# Patient Record
Sex: Female | Born: 1950 | Race: White | Hispanic: No | Marital: Married | Smoking: Never smoker
Health system: Southern US, Community
[De-identification: ages and names within clinical notes are randomized; demographics above are authoritative.]

## PROBLEM LIST (undated history)

## (undated) DIAGNOSIS — Z201 Contact with and (suspected) exposure to tuberculosis: Secondary | ICD-10-CM

## (undated) DIAGNOSIS — L4050 Arthropathic psoriasis, unspecified: Secondary | ICD-10-CM

## (undated) HISTORY — PX: TONSILLECTOMY: SUR1361

## (undated) HISTORY — PX: ABDOMINAL HYSTERECTOMY: SHX81

---

## 1981-05-26 HISTORY — PX: BREAST EXCISIONAL BIOPSY: SUR124

## 1981-05-26 HISTORY — PX: BREAST BIOPSY: SHX20

## 2006-02-23 ENCOUNTER — Ambulatory Visit: Payer: Self-pay | Admitting: Specialist

## 2007-07-20 ENCOUNTER — Ambulatory Visit: Payer: Self-pay | Admitting: Internal Medicine

## 2008-07-20 ENCOUNTER — Ambulatory Visit: Payer: Self-pay | Admitting: Internal Medicine

## 2009-08-23 ENCOUNTER — Ambulatory Visit: Payer: Self-pay | Admitting: Internal Medicine

## 2010-08-18 ENCOUNTER — Emergency Department: Payer: Self-pay | Admitting: Internal Medicine

## 2010-09-12 ENCOUNTER — Ambulatory Visit: Payer: Self-pay | Admitting: Internal Medicine

## 2011-10-08 ENCOUNTER — Ambulatory Visit: Payer: Self-pay | Admitting: Internal Medicine

## 2012-11-08 ENCOUNTER — Ambulatory Visit: Payer: Self-pay | Admitting: Internal Medicine

## 2014-01-10 ENCOUNTER — Ambulatory Visit: Payer: Self-pay | Admitting: Internal Medicine

## 2014-12-12 ENCOUNTER — Other Ambulatory Visit: Payer: Self-pay | Admitting: Internal Medicine

## 2014-12-12 DIAGNOSIS — Z1231 Encounter for screening mammogram for malignant neoplasm of breast: Secondary | ICD-10-CM

## 2015-01-12 ENCOUNTER — Ambulatory Visit: Payer: Self-pay

## 2015-01-18 ENCOUNTER — Ambulatory Visit
Admission: RE | Admit: 2015-01-18 | Discharge: 2015-01-18 | Disposition: A | Discharge status: Home / Self Care | Payer: 59 | Source: Ambulatory Visit | Attending: Internal Medicine | Admitting: Internal Medicine

## 2015-01-18 DIAGNOSIS — Z1231 Encounter for screening mammogram for malignant neoplasm of breast: Secondary | ICD-10-CM | POA: Insufficient documentation

## 2015-12-14 ENCOUNTER — Other Ambulatory Visit: Payer: Self-pay | Admitting: Internal Medicine

## 2015-12-14 DIAGNOSIS — Z1231 Encounter for screening mammogram for malignant neoplasm of breast: Secondary | ICD-10-CM

## 2016-01-24 ENCOUNTER — Ambulatory Visit
Admission: RE | Admit: 2016-01-24 | Discharge: 2016-01-24 | Disposition: A | Discharge status: Home / Self Care | Payer: BLUE CROSS/BLUE SHIELD | Source: Ambulatory Visit | Attending: Internal Medicine | Admitting: Internal Medicine

## 2016-01-24 DIAGNOSIS — Z1231 Encounter for screening mammogram for malignant neoplasm of breast: Secondary | ICD-10-CM | POA: Diagnosis not present

## 2016-06-30 ENCOUNTER — Other Ambulatory Visit: Payer: Self-pay | Admitting: Otolaryngology

## 2016-06-30 DIAGNOSIS — H9190 Unspecified hearing loss, unspecified ear: Secondary | ICD-10-CM

## 2016-07-11 ENCOUNTER — Ambulatory Visit: Admission: RE | Admit: 2016-07-11 | Payer: BLUE CROSS/BLUE SHIELD | Source: Ambulatory Visit

## 2016-08-06 ENCOUNTER — Ambulatory Visit
Admission: RE | Admit: 2016-08-06 | Discharge: 2016-08-06 | Disposition: A | Discharge status: Home / Self Care | Payer: Medicare HMO | Source: Ambulatory Visit | Attending: Otolaryngology | Admitting: Otolaryngology

## 2016-08-06 DIAGNOSIS — H9190 Unspecified hearing loss, unspecified ear: Secondary | ICD-10-CM | POA: Diagnosis present

## 2016-08-06 LAB — POCT I-STAT CREATININE: CREATININE: 0.9 mg/dL (ref 0.44–1.00)

## 2016-08-06 MED ORDER — GADOBENATE DIMEGLUMINE 529 MG/ML IV SOLN
15.0000 mL | Freq: Once | INTRAVENOUS | Status: AC | PRN
  Administered 2016-08-06: 15 mL via INTRAVENOUS

## 2016-12-12 ENCOUNTER — Other Ambulatory Visit: Payer: Self-pay | Admitting: Internal Medicine

## 2016-12-18 ENCOUNTER — Other Ambulatory Visit: Payer: Self-pay | Admitting: Internal Medicine

## 2016-12-18 DIAGNOSIS — Z1231 Encounter for screening mammogram for malignant neoplasm of breast: Secondary | ICD-10-CM

## 2016-12-18 DIAGNOSIS — E538 Deficiency of other specified B group vitamins: Secondary | ICD-10-CM | POA: Insufficient documentation

## 2016-12-18 DIAGNOSIS — E782 Mixed hyperlipidemia: Secondary | ICD-10-CM | POA: Insufficient documentation

## 2017-01-15 ENCOUNTER — Ambulatory Visit
Admission: RE | Admit: 2017-01-15 | Discharge: 2017-01-15 | Disposition: A | Discharge status: Home / Self Care | Payer: Medicare HMO | Source: Ambulatory Visit | Attending: Internal Medicine | Admitting: Internal Medicine

## 2017-01-15 DIAGNOSIS — Z1231 Encounter for screening mammogram for malignant neoplasm of breast: Secondary | ICD-10-CM

## 2017-01-15 DIAGNOSIS — R928 Other abnormal and inconclusive findings on diagnostic imaging of breast: Secondary | ICD-10-CM | POA: Diagnosis not present

## 2017-01-15 DIAGNOSIS — N6310 Unspecified lump in the right breast, unspecified quadrant: Secondary | ICD-10-CM | POA: Insufficient documentation

## 2017-01-19 ENCOUNTER — Other Ambulatory Visit: Payer: Self-pay | Admitting: Internal Medicine

## 2017-01-19 DIAGNOSIS — N6310 Unspecified lump in the right breast, unspecified quadrant: Secondary | ICD-10-CM

## 2017-01-19 DIAGNOSIS — N6320 Unspecified lump in the left breast, unspecified quadrant: Secondary | ICD-10-CM

## 2017-01-19 DIAGNOSIS — R928 Other abnormal and inconclusive findings on diagnostic imaging of breast: Secondary | ICD-10-CM

## 2017-01-29 ENCOUNTER — Ambulatory Visit
Admission: RE | Admit: 2017-01-29 | Discharge: 2017-01-29 | Disposition: A | Discharge status: Home / Self Care | Payer: Medicare HMO | Source: Ambulatory Visit | Attending: Internal Medicine | Admitting: Internal Medicine

## 2017-01-29 DIAGNOSIS — N6310 Unspecified lump in the right breast, unspecified quadrant: Secondary | ICD-10-CM | POA: Diagnosis present

## 2017-01-29 DIAGNOSIS — N6320 Unspecified lump in the left breast, unspecified quadrant: Secondary | ICD-10-CM

## 2017-01-29 DIAGNOSIS — N6002 Solitary cyst of left breast: Secondary | ICD-10-CM | POA: Insufficient documentation

## 2017-01-29 DIAGNOSIS — N6001 Solitary cyst of right breast: Secondary | ICD-10-CM | POA: Diagnosis not present

## 2017-01-29 DIAGNOSIS — R928 Other abnormal and inconclusive findings on diagnostic imaging of breast: Secondary | ICD-10-CM

## 2017-12-25 ENCOUNTER — Other Ambulatory Visit: Payer: Self-pay | Admitting: Internal Medicine

## 2017-12-25 DIAGNOSIS — Z1231 Encounter for screening mammogram for malignant neoplasm of breast: Secondary | ICD-10-CM

## 2018-02-04 ENCOUNTER — Ambulatory Visit
Admission: RE | Admit: 2018-02-04 | Discharge: 2018-02-04 | Disposition: A | Discharge status: Home / Self Care | Payer: Medicare HMO | Source: Ambulatory Visit | Attending: Internal Medicine | Admitting: Internal Medicine

## 2018-02-04 DIAGNOSIS — Z1231 Encounter for screening mammogram for malignant neoplasm of breast: Secondary | ICD-10-CM | POA: Insufficient documentation

## 2019-01-10 ENCOUNTER — Other Ambulatory Visit: Payer: Self-pay | Admitting: Internal Medicine

## 2019-01-10 DIAGNOSIS — Z1231 Encounter for screening mammogram for malignant neoplasm of breast: Secondary | ICD-10-CM

## 2019-02-17 ENCOUNTER — Ambulatory Visit
Admission: RE | Admit: 2019-02-17 | Discharge: 2019-02-17 | Disposition: A | Discharge status: Home / Self Care | Payer: Medicare HMO | Source: Ambulatory Visit | Attending: Internal Medicine | Admitting: Internal Medicine

## 2019-02-17 DIAGNOSIS — Z1231 Encounter for screening mammogram for malignant neoplasm of breast: Secondary | ICD-10-CM | POA: Diagnosis present

## 2019-08-05 ENCOUNTER — Other Ambulatory Visit: Payer: Self-pay | Admitting: Surgery

## 2019-08-05 ENCOUNTER — Other Ambulatory Visit: Payer: Self-pay | Admitting: Internal Medicine

## 2019-08-05 DIAGNOSIS — M5416 Radiculopathy, lumbar region: Secondary | ICD-10-CM

## 2019-08-05 DIAGNOSIS — M5431 Sciatica, right side: Secondary | ICD-10-CM

## 2019-08-14 ENCOUNTER — Ambulatory Visit
Admission: RE | Admit: 2019-08-14 | Discharge: 2019-08-14 | Disposition: A | Discharge status: Home / Self Care | Payer: Medicare HMO | Source: Ambulatory Visit | Attending: Internal Medicine | Admitting: Internal Medicine

## 2019-08-14 ENCOUNTER — Other Ambulatory Visit: Payer: Self-pay

## 2019-08-14 DIAGNOSIS — M5431 Sciatica, right side: Secondary | ICD-10-CM | POA: Diagnosis present

## 2019-08-14 DIAGNOSIS — M5416 Radiculopathy, lumbar region: Secondary | ICD-10-CM | POA: Diagnosis not present

## 2019-08-19 DIAGNOSIS — G8929 Other chronic pain: Secondary | ICD-10-CM | POA: Insufficient documentation

## 2019-08-19 DIAGNOSIS — M5441 Lumbago with sciatica, right side: Secondary | ICD-10-CM | POA: Insufficient documentation

## 2020-02-06 ENCOUNTER — Other Ambulatory Visit: Payer: Self-pay | Admitting: Internal Medicine

## 2020-02-06 DIAGNOSIS — Z1231 Encounter for screening mammogram for malignant neoplasm of breast: Secondary | ICD-10-CM

## 2020-02-23 ENCOUNTER — Ambulatory Visit
Admission: RE | Admit: 2020-02-23 | Discharge: 2020-02-23 | Disposition: A | Discharge status: Home / Self Care | Payer: Medicare HMO | Source: Ambulatory Visit | Attending: Internal Medicine | Admitting: Internal Medicine

## 2020-02-23 ENCOUNTER — Other Ambulatory Visit: Payer: Self-pay

## 2020-02-23 DIAGNOSIS — Z1231 Encounter for screening mammogram for malignant neoplasm of breast: Secondary | ICD-10-CM | POA: Diagnosis not present

## 2020-08-20 ENCOUNTER — Ambulatory Visit (LOCAL_COMMUNITY_HEALTH_CENTER): Payer: Self-pay

## 2020-08-20 ENCOUNTER — Other Ambulatory Visit: Payer: Self-pay

## 2020-08-20 VITALS — Ht 62.0 in | Wt 149.0 lb

## 2020-08-20 DIAGNOSIS — R7612 Nonspecific reaction to cell mediated immunity measurement of gamma interferon antigen response without active tuberculosis: Secondary | ICD-10-CM

## 2020-08-20 NOTE — Progress Notes (Addendum)
EPI completed via phone.  +QFT referral from [facility] Rheumatology. 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 States last TB test was probably when she was a teenager and was negative. No known exposures to TB.  Started Methotrexate last Friday (08/10/20) and has her next appt with Dr. Allena Katz 09/20/20.  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   After reviewing patient's hx, TB RN suggested that patient have QFT repeated at her next Rheum appt. TB RN will mail TB info and TB RN contact info and add patient to Rifampin list.  Declines HIV testing at this time via phone. Richmond Campbell, RN   See care everywhere for [facility] Rheum notes and labs.  CXR sent for scanning Richmond Campbell, RN

## 2020-08-21 ENCOUNTER — Telehealth: Payer: Self-pay

## 2020-08-21 NOTE — Telephone Encounter (Signed)
Fax sent to [facility] Rheumatology suggesting pt have repeat QFT at next appt.  TB RN attempted calling office but had to hang up d/t long wait times. Richmond Campbell, RN

## 2020-09-10 DIAGNOSIS — R7612 Nonspecific reaction to cell mediated immunity measurement of gamma interferon antigen response without active tuberculosis: Secondary | ICD-10-CM | POA: Insufficient documentation

## 2020-09-10 NOTE — Progress Notes (Signed)
Received fax from Dr. Gerome Apley re: patient's repeat (2nd) QFT.  See care everywhere for 2 positive QFT TB results. TB RN will call patient once Rifampin issues resolve. EPI completed previously when 1st QFT received. Richmond Campbell, RN

## 2021-02-04 ENCOUNTER — Other Ambulatory Visit: Payer: Self-pay | Admitting: Internal Medicine

## 2021-02-04 DIAGNOSIS — Z1231 Encounter for screening mammogram for malignant neoplasm of breast: Secondary | ICD-10-CM

## 2021-02-28 ENCOUNTER — Ambulatory Visit
Admission: RE | Admit: 2021-02-28 | Discharge: 2021-02-28 | Disposition: A | Discharge status: Home / Self Care | Payer: Medicare HMO | Source: Ambulatory Visit | Attending: Internal Medicine | Admitting: Internal Medicine

## 2021-02-28 ENCOUNTER — Other Ambulatory Visit: Payer: Self-pay

## 2021-02-28 DIAGNOSIS — Z1231 Encounter for screening mammogram for malignant neoplasm of breast: Secondary | ICD-10-CM | POA: Insufficient documentation

## 2021-03-29 IMAGING — MG DIGITAL SCREENING BILAT W/ TOMO W/ CAD
8 series · 8 of 24 positions shown · non-contrast
Comparison: Previous exam(s).

CLINICAL DATA: Screening.

EXAM:
DIGITAL SCREENING BILATERAL MAMMOGRAM WITH TOMO AND CAD

[L MLO synth-2D]
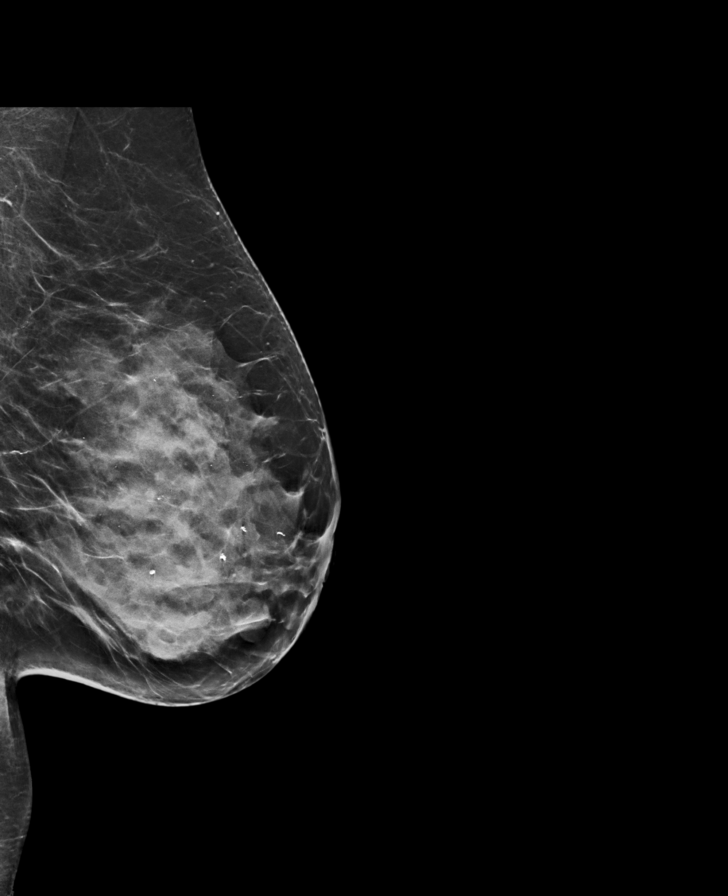

[R CC synth-2D]
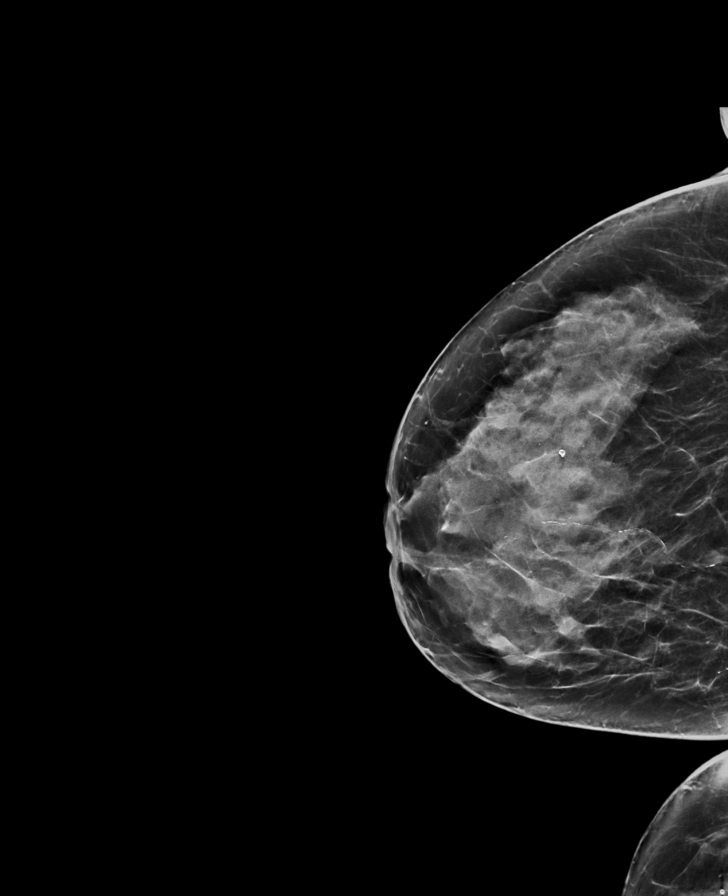

[R MLO synth-2D]
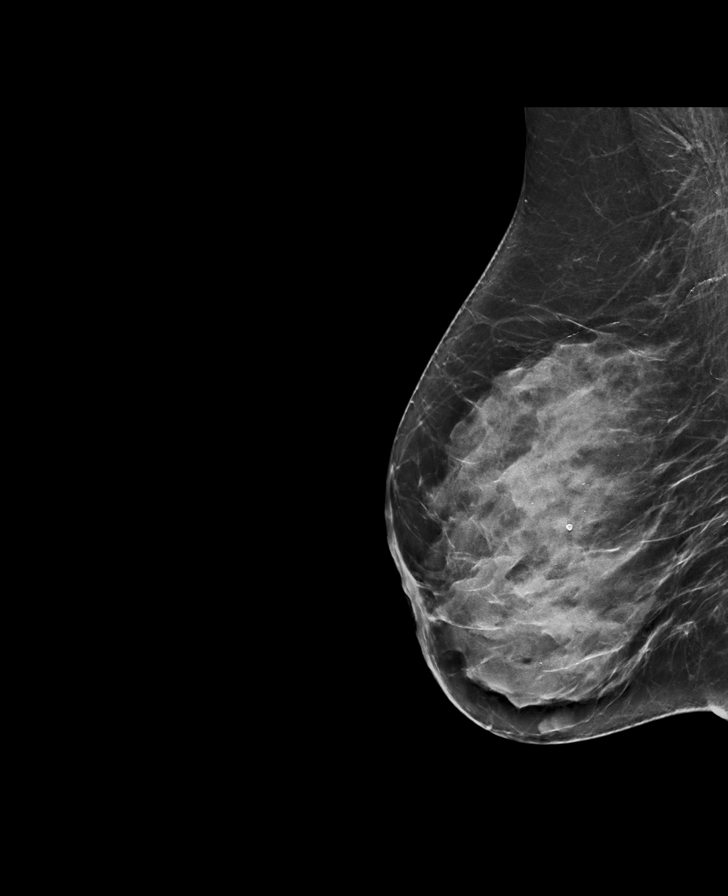

[L CC synth-2D]
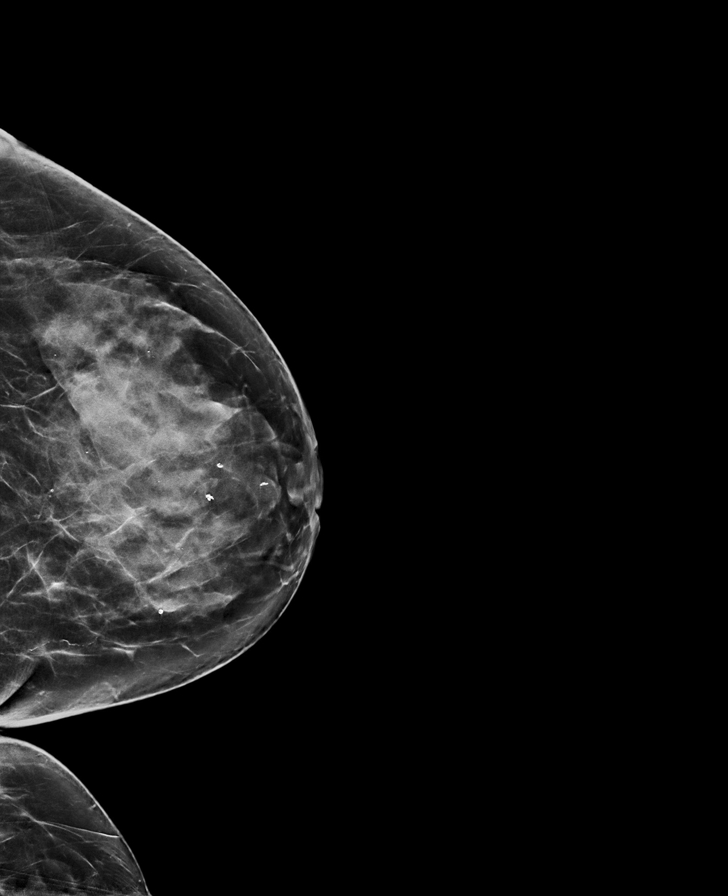

[L MLO tomo · tomo slice 33/65.0]
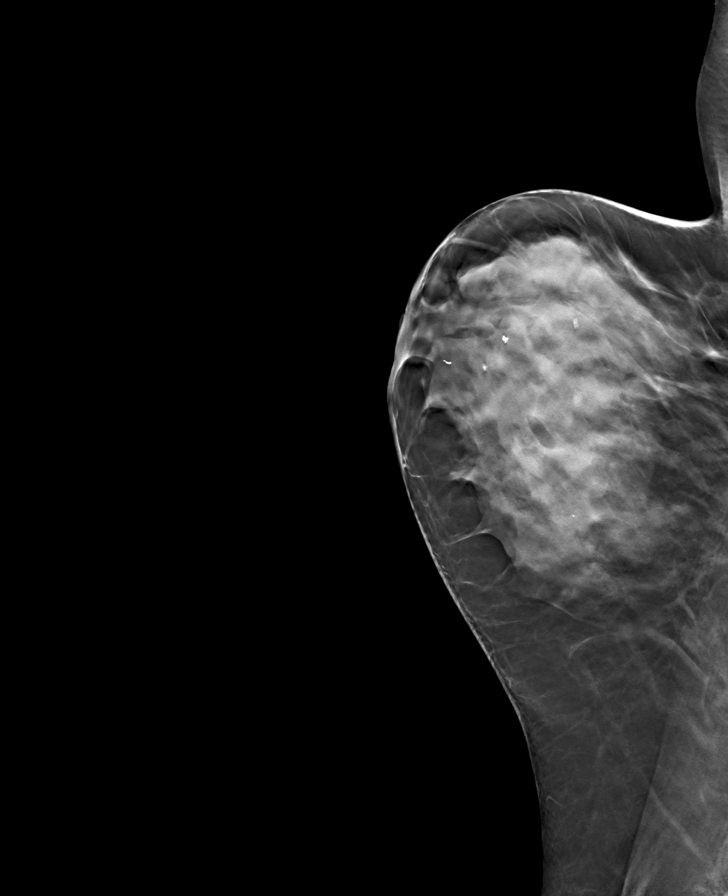

[R CC tomo · tomo slice 34/67.0]
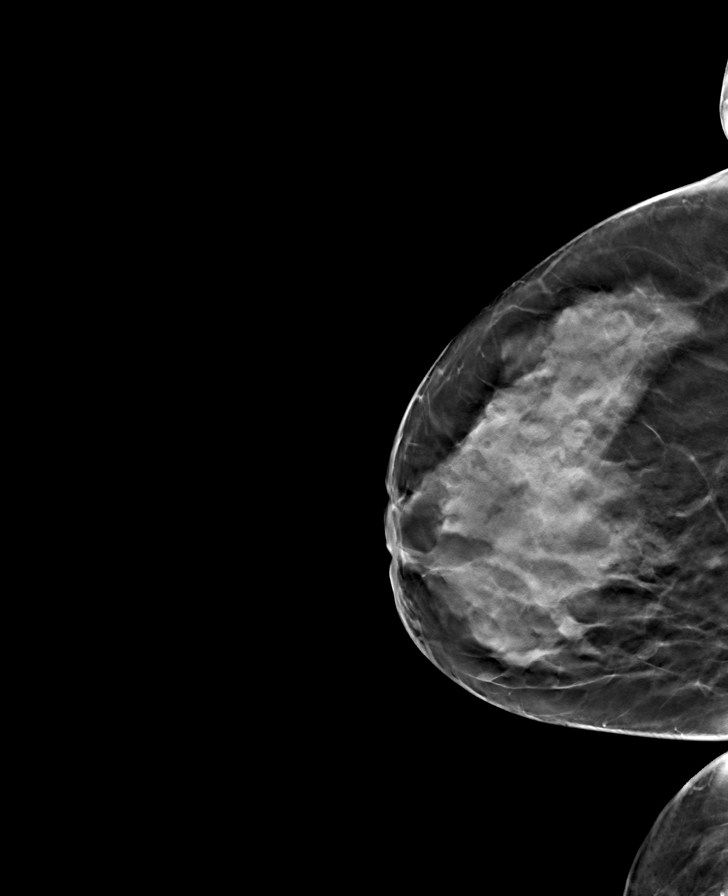

[R MLO tomo · tomo slice 34/67.0]
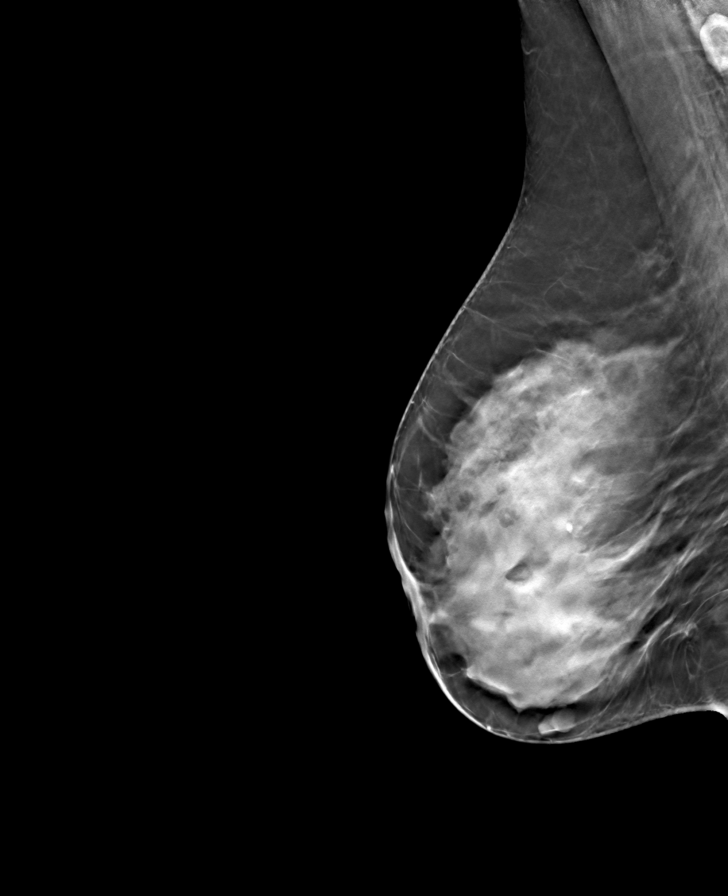

[L CC tomo · tomo slice 35/70.0]
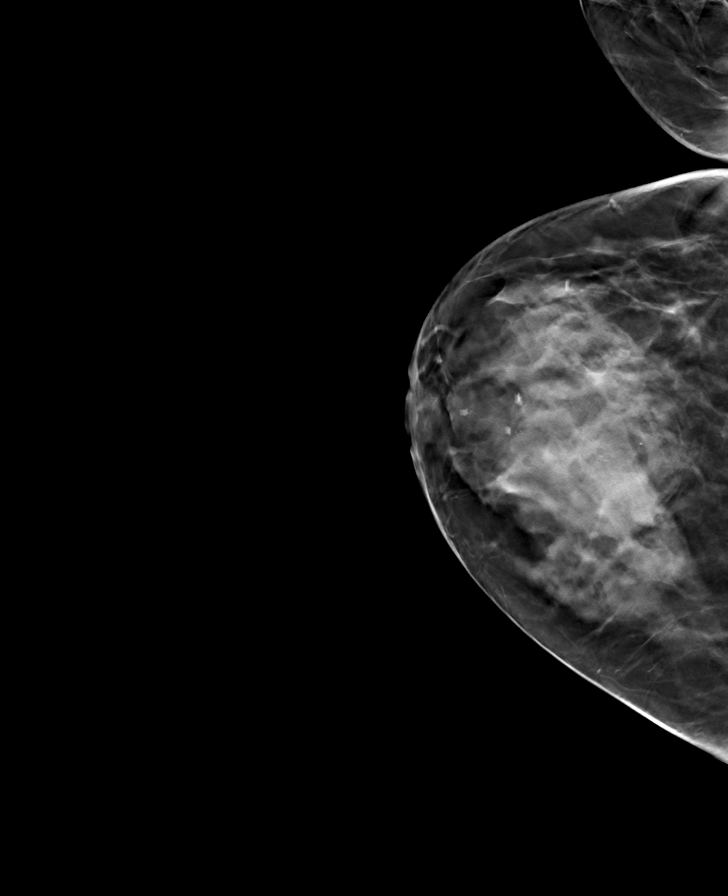

[8 of 24 positions shown; findings below may reference images not displayed]

ACR Breast Density Category d: The breast tissue is extremely dense,
which lowers the sensitivity of mammography
FINDINGS: There are no findings suspicious for malignancy. Images were
processed with CAD.
IMPRESSION: No mammographic evidence of malignancy. A result letter of this
screening mammogram will be mailed directly to the patient.

RECOMMENDATION:
Screening mammogram in one year. (Code:[id number])

BI-RADS CATEGORY  1: Negative.

## 2021-06-19 ENCOUNTER — Telehealth: Payer: Self-pay

## 2021-06-19 ENCOUNTER — Other Ambulatory Visit: Payer: Self-pay | Admitting: Surgery

## 2021-06-21 NOTE — Progress Notes (Signed)
Tuberculosis treatment orders  All patients are to be monitored per [place] and county TB policies.   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 Treat for latent TB per the following:  Rifampin 600mg  daily by mouth x 4 months, draw LFTs monthly per Dr. .  Baselines labs at TB start appt. Offer HIV testing.

## 2021-06-24 ENCOUNTER — Other Ambulatory Visit: Payer: Self-pay

## 2021-06-24 ENCOUNTER — Ambulatory Visit (LOCAL_COMMUNITY_HEALTH_CENTER): Payer: Self-pay

## 2021-06-24 VITALS — Ht 62.0 in | Wt 142.0 lb

## 2021-06-24 DIAGNOSIS — R7612 Nonspecific reaction to cell mediated immunity measurement of gamma interferon antigen response without active tuberculosis: Secondary | ICD-10-CM

## 2021-06-24 MED ORDER — RIFAMPIN 300 MG PO CAPS: 600.0000 mg | ORAL_CAPSULE | Freq: Every day | ORAL | 0 refills | Status: AC

## 2021-06-24 NOTE — Telephone Encounter (Signed)
TC from patient.  States she is still interested in Rifampin for LTBI tx.  TBM appt scheduled. Updated meds via phone, no changes with medical hx or problems.  No changes to EPI on 08/20/2020 Richmond Campbell, RN

## 2021-06-24 NOTE — Progress Notes (Signed)
No changes to EPI as of 06/21/21. Patient still interested in LTBI tx.  Updated medications. TB start scheduled for 06/24/21 Aileen Fass, RN

## 2021-06-25 LAB — CBC WITH DIFFERENTIAL/PLATELET
Basophils Absolute: 0 10*3/uL (ref 0.0–0.2)
Basos: 0 %
EOS (ABSOLUTE): 0.2 10*3/uL (ref 0.0–0.4)
Eos: 2 %
Hematocrit: 36.1 % (ref 34.0–46.6)
Hemoglobin: 12 g/dL (ref 11.1–15.9)
Immature Grans (Abs): 0 10*3/uL (ref 0.0–0.1)
Immature Granulocytes: 0 %
Lymphocytes Absolute: 3.1 10*3/uL (ref 0.7–3.1)
Lymphs: 40 %
MCH: 32.6 pg (ref 26.6–33.0)
MCHC: 33.2 g/dL (ref 31.5–35.7)
MCV: 98 fL — ABNORMAL HIGH (ref 79–97)
Monocytes Absolute: 0.4 10*3/uL (ref 0.1–0.9)
Monocytes: 5 %
Neutrophils Absolute: 4.2 10*3/uL (ref 1.4–7.0)
Neutrophils: 53 %
Platelets: 265 10*3/uL (ref 150–450)
RBC: 3.68 x10E6/uL — ABNORMAL LOW (ref 3.77–5.28)
RDW: 13.3 % (ref 11.7–15.4)
WBC: 7.8 10*3/uL (ref 3.4–10.8)

## 2021-06-25 LAB — HEPATIC FUNCTION PANEL
ALT: 45 IU/L — ABNORMAL HIGH (ref 0–32)
AST: 47 IU/L — ABNORMAL HIGH (ref 0–40)
Albumin: 4.8 g/dL (ref 3.8–4.8)
Alkaline Phosphatase: 62 IU/L (ref 44–121)
Bilirubin Total: 0.3 mg/dL (ref 0.0–1.2)
Bilirubin, Direct: <0.1 mg/dL (ref 0.00–0.40)
Total Protein: 6.7 g/dL (ref 6.0–8.5)

## 2021-06-25 NOTE — Progress Notes (Signed)
Pt in clinic today for Rifampin start appt.  LTBI tx start letter faxed to Dr. Allena Katz at [facility]. Rifampin 300mg  #60 dispensed per Dr. order. Wyvonnia Lora, RN   Next TBM appt 07/22/21

## 2021-07-04 ENCOUNTER — Telehealth: Payer: Self-pay

## 2021-07-05 NOTE — Telephone Encounter (Signed)
TC from patient.  C/o itching and hot flashes in the last few days after taking Rifampin at night.  Denies SOB, rash or abdominal pain. Patient instructed to stop Rifampin for today, tomorrow and Sunday to see if itching stops and hot flashes improve.  Patient will call TB control back on Monday and update.  Discussed may have to add in Zyrtec or Benadryl if she wants to continue Rifampin or change to INH.  Patient doesn't want to do INH x 6 months nor does she want to itch x 4 months.  Will await patient's TC on Monday. Richmond Campbell, RN

## 2021-07-08 ENCOUNTER — Telehealth: Payer: Self-pay

## 2021-07-22 ENCOUNTER — Ambulatory Visit (LOCAL_COMMUNITY_HEALTH_CENTER): Payer: Self-pay

## 2021-07-22 ENCOUNTER — Other Ambulatory Visit: Payer: Self-pay

## 2021-07-22 VITALS — Wt 142.5 lb

## 2021-07-22 DIAGNOSIS — R7612 Nonspecific reaction to cell mediated immunity measurement of gamma interferon antigen response without active tuberculosis: Secondary | ICD-10-CM

## 2021-07-22 MED ORDER — RIFAMPIN 300 MG PO CAPS: 600.0000 mg | ORAL_CAPSULE | Freq: Every day | ORAL | 0 refills | Status: AC

## 2021-07-22 NOTE — Progress Notes (Signed)
In nurse clinic for LTBI / TB med management / due for Rifampin #2.   Reports taking Rifampin daily as prescribed. Has approx 5 days worth of Rifampin remaining in current bottle. Pt explains she stopped the Rifampin for 3 days d/t the generatlized itching and "sweating".  See RN note 07/04/2021. Pt restarted the Rifampin  after 3 days off and takes Benadryl nightly and itching has dramatically improved. Continues to have "sweats" approx 2 hrs after taking Rifampin, in the evening,  and sweating is gone by next morning.   Pt explains she is committed to completing Rifampin.   Per Consult with Dr Ralene Bathe through epic message, ok to continue Rifampin.   Rifampin 300 mg #60 dispensed today per order by Ralene Bathe, MD. RN walked pt to lab for LFT's.  Next TB appt 08/19/2021 at 10:15 am, has reminder. Advised to contact [facility] with questions, concerns, side effects. TB coord. Contact card given. Pt declined Rifampin info sheet explaining she has one. Jerel Shepherd, RN

## 2021-07-23 ENCOUNTER — Telehealth: Payer: Self-pay

## 2021-07-23 LAB — HEPATIC FUNCTION PANEL
ALT: 12 IU/L (ref 0–32)
AST: 25 IU/L (ref 0–40)
Albumin: 4.7 g/dL (ref 3.8–4.8)
Alkaline Phosphatase: 66 IU/L (ref 44–121)
Bilirubin Total: <0.2 mg/dL (ref 0.0–1.2)
Bilirubin, Direct: <0.1 mg/dL (ref 0.00–0.40)
Total Protein: 6.8 g/dL (ref 6.0–8.5)

## 2021-07-23 NOTE — Telephone Encounter (Signed)
TC to patient re: if still taking Rifampin? States has continued taking Rifampin in the evenings and is taking Benadryl when she itches bad.  Still no rash and now she's wondering if its dry skin.  Still having hot flashes but not as bad as previously. Patient is committed to continuing Rifampin tx.  Dr. Alvester Morin consulted and is ok with this plan.Richmond Campbell, RN

## 2021-07-23 NOTE — Telephone Encounter (Signed)
Patient reports after stopping meds she still was having hot flashes and some itching.  CO may take benadryl with Rifampin or we can attempt INH for LTBI tx .  Patient would prefer to continue with Rifampin and f/u with TB nurse as needed.  Richmond Campbell, RN

## 2021-07-31 NOTE — Progress Notes (Signed)
Attestation of Attending Supervision of clinical support staff: I agree with the care provided to this patient and was available for any consultation.  I have reviewed the RN's note and chart. I was available for consult and to see the patient if needed.   Rosemary Pentecost Niles Chairty Toman, MD, MPH, ABFM Attending Physician Faculty Practice- [facility]  

## 2021-08-19 ENCOUNTER — Ambulatory Visit (LOCAL_COMMUNITY_HEALTH_CENTER): Payer: Medicare HMO

## 2021-08-19 ENCOUNTER — Other Ambulatory Visit: Payer: Self-pay

## 2021-08-19 VITALS — Wt 143.0 lb

## 2021-08-19 DIAGNOSIS — R7612 Nonspecific reaction to cell mediated immunity measurement of gamma interferon antigen response without active tuberculosis: Secondary | ICD-10-CM

## 2021-08-19 MED ORDER — RIFAMPIN 300 MG PO CAPS: 600.0000 mg | ORAL_CAPSULE | Freq: Every day | ORAL | 0 refills | Status: AC

## 2021-08-19 NOTE — Progress Notes (Signed)
Reports taking Rifampin daily as prescribed and has 7 days of pills remaining in current bottle.   ?Continues to have hot flashes within 2-3 hours after taking rifampin at 5 or 5:30 pm and states they continue during the night; none during the day.  Denies c/o itching as before and currently not needing the benadryl.  C/o both legs aching and right wrist  aching starting about 2 weeks ago but not daily.  Pt concerned that it could be related to rifampin.  Says alleve doesn't really help and she applies icy hot.   ? ?Wants to continue Rifampin and eager to complete.   ? ?Consult with Dr. Alvester Morin via epic message regarding leg aches and left wrist aching.  Dr. Alvester Morin said not likely due to rifampin, see PCP if needed, draw LFT today as scheduled and followup with these results; pt agreed. ? ?Rifampin 300 mg #60 dispensed today per order by Karyl Kinnier, MD. ? ?Next TB appt 09/18/21 at 10am ; has reminder.   ?Instructed to contact [facility] with questions, concerns, side effects. TB coord contact card given. ?Pt declined Rifampin info sheet because she has one.   ? ?Cherlynn Polo, RN  ? ? ?

## 2021-08-20 LAB — HEPATIC FUNCTION PANEL
ALT: 14 IU/L (ref 0–32)
AST: 19 IU/L (ref 0–40)
Albumin: 4.6 g/dL (ref 3.8–4.8)
Alkaline Phosphatase: 73 IU/L (ref 44–121)
Bilirubin Total: <0.2 mg/dL (ref 0.0–1.2)
Bilirubin, Direct: <0.1 mg/dL (ref 0.00–0.40)
Total Protein: 6.9 g/dL (ref 6.0–8.5)

## 2021-09-18 ENCOUNTER — Ambulatory Visit (LOCAL_COMMUNITY_HEALTH_CENTER): Payer: Self-pay | Admitting: Surgery

## 2021-09-18 VITALS — Wt 144.0 lb

## 2021-09-18 DIAGNOSIS — R7612 Nonspecific reaction to cell mediated immunity measurement of gamma interferon antigen response without active tuberculosis: Secondary | ICD-10-CM

## 2021-09-18 MED ORDER — RIFAMPIN 300 MG PO CAPS: 600.0000 mg | ORAL_CAPSULE | Freq: Every day | ORAL | 0 refills | Status: AC

## 2021-09-18 NOTE — Progress Notes (Signed)
Patient has been taking Rifampin 600 mg daily for 3 months for LTBI treatment. ? ?Patient is doing well on current medication regimen. No n/v/f/c, eating normally, no concerning weight loss. Patient reports taking medications daily as prescribed, she has 7 more days of pills. ? ?Dispensed #4 and final month of Rifampin for LTBI tx. Dispensed #60 300 mg capsules.  ? ?Explained to patient that will always be positive on PPD skin test and blood test for exposure to TB. If patient is asked by employer, school, other institution to take a TB test, patient should supply proof of treatment completion and/or obtain a TB Screening at the health department or at patient's PCP. This information was also provided in writing in the patient's completion letter which was given to the patient today along with a TB treatment completion card.  ? ?A completion letter was also sent by fax to the patient's PCP: ? ?Dr. Posey Pronto, MD (Rheum) [phone] (Fax) ?Dr. Iran Planas (PCP) [phone] (Fax)   ? ?All completion letters and completion card were sent for scanning into Epic. ? ?Patient was advised to contact [facility]/TB control phone for any concerning symptoms or questions.  ? ?FU LFTs which were drawn today. ? ?Leigh Aurora, MD  ?

## 2021-09-19 LAB — HEPATIC FUNCTION PANEL
ALT: 12 IU/L (ref 0–32)
AST: 21 IU/L (ref 0–40)
Albumin: 4.6 g/dL (ref 3.8–4.8)
Alkaline Phosphatase: 73 IU/L (ref 44–121)
Bilirubin Total: 0.2 mg/dL (ref 0.0–1.2)
Bilirubin, Direct: <0.1 mg/dL (ref 0.00–0.40)
Total Protein: 6.9 g/dL (ref 6.0–8.5)

## 2022-01-29 ENCOUNTER — Other Ambulatory Visit: Payer: Self-pay | Admitting: Internal Medicine

## 2022-01-29 DIAGNOSIS — Z1231 Encounter for screening mammogram for malignant neoplasm of breast: Secondary | ICD-10-CM

## 2022-03-27 ENCOUNTER — Ambulatory Visit
Admission: RE | Admit: 2022-03-27 | Discharge: 2022-03-27 | Disposition: A | Discharge status: Home / Self Care | Payer: Medicare HMO | Source: Ambulatory Visit | Attending: Internal Medicine | Admitting: Internal Medicine

## 2022-03-27 DIAGNOSIS — Z1231 Encounter for screening mammogram for malignant neoplasm of breast: Secondary | ICD-10-CM | POA: Diagnosis present

## 2023-02-18 ENCOUNTER — Other Ambulatory Visit: Payer: Self-pay | Admitting: Internal Medicine

## 2023-02-18 ENCOUNTER — Encounter: Payer: Self-pay | Admitting: Ophthalmology

## 2023-02-18 DIAGNOSIS — Z1231 Encounter for screening mammogram for malignant neoplasm of breast: Secondary | ICD-10-CM

## 2023-02-18 NOTE — Anesthesia Preprocedure Evaluation (Addendum)
Anesthesia Evaluation  Patient identified by MRN, date of birth, ID band Patient awake    Reviewed: Allergy & Precautions, H&P , NPO status , Patient's Chart, lab work & pertinent test results  Airway Mallampati: III  TM Distance: >3 FB Neck ROM: Full    Dental no notable dental hx. (+) Caps   Pulmonary neg pulmonary ROS   Pulmonary exam normal breath sounds clear to auscultation       Cardiovascular negative cardio ROS Normal cardiovascular exam Rhythm:Regular Rate:Normal     Neuro/Psych  Neuromuscular disease negative neurological ROS  negative psych ROS   GI/Hepatic negative GI ROS, Neg liver ROS,,,  Endo/Other  negative endocrine ROS    Renal/GU negative Renal ROS  negative genitourinary   Musculoskeletal negative musculoskeletal ROS (+) Arthritis ,    Abdominal   Peds negative pediatric ROS (+) Delivery details - Hematology negative hematology ROS (+)   Anesthesia Other Findings Psoriatic arthritis 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  Reproductive/Obstetrics negative OB ROS                             Anesthesia Physical Anesthesia Plan  ASA: 2  Anesthesia Plan: MAC   Post-op Pain Management:    Induction: Intravenous  PONV Risk Score and Plan:   Airway Management Planned: Natural Airway and Nasal Cannula  Additional Equipment:   Intra-op Plan:   Post-operative Plan:   Informed Consent: I have reviewed the patients History and Physical, chart, labs and discussed the procedure including the risks, benefits and alternatives for the proposed anesthesia with the patient or authorized representative who has indicated his/her understanding and acceptance.     Dental Advisory Given  Plan Discussed with: Anesthesiologist, CRNA and Surgeon  Anesthesia Plan Comments: (Patient consented for risks of anesthesia including but not limited to:  - adverse reactions to medications - damage to  eyes, teeth, lips or other oral mucosa - nerve damage due to positioning  - sore throat or hoarseness - Damage to heart, brain, nerves, lungs, other parts of body or loss of life  Patient voiced understanding.)       Anesthesia Quick Evaluation

## 2023-02-24 ENCOUNTER — Encounter: Payer: Self-pay | Admitting: Ophthalmology

## 2023-03-02 NOTE — Discharge Instructions (Signed)

## 2023-03-03 ENCOUNTER — Other Ambulatory Visit: Payer: Self-pay

## 2023-03-03 ENCOUNTER — Ambulatory Visit: Payer: Medicare HMO | Admitting: Anesthesiology

## 2023-03-03 ENCOUNTER — Ambulatory Visit
Admission: RE | Admit: 2023-03-03 | Discharge: 2023-03-03 | Disposition: A | Discharge status: Home / Self Care | Payer: Medicare HMO | Attending: Ophthalmology | Admitting: Ophthalmology

## 2023-03-03 ENCOUNTER — Encounter: Payer: Self-pay | Admitting: Ophthalmology

## 2023-03-03 ENCOUNTER — Encounter
Admission: RE | Disposition: A | Discharge status: Home / Self Care | Payer: Self-pay | Source: Home / Self Care | Attending: Ophthalmology

## 2023-03-03 DIAGNOSIS — H2512 Age-related nuclear cataract, left eye: Secondary | ICD-10-CM | POA: Diagnosis present

## 2023-03-03 HISTORY — PX: CATARACT EXTRACTION W/PHACO: SHX586

## 2023-03-03 HISTORY — DX: Arthropathic psoriasis, unspecified: L40.50

## 2023-03-03 HISTORY — DX: Contact with and (suspected) exposure to tuberculosis: Z20.1

## 2023-03-03 SURGERY — PHACOEMULSIFICATION, CATARACT, WITH IOL INSERTION
Anesthesia: Monitor Anesthesia Care | Site: Eye | Laterality: Left

## 2023-03-03 MED ORDER — SIGHTPATH DOSE#1 BSS IO SOLN
INTRAOCULAR | Status: DC | PRN
  Administered 2023-03-03: 15 mL via INTRAOCULAR

## 2023-03-03 MED ORDER — SIGHTPATH DOSE#1 BSS IO SOLN
INTRAOCULAR | Status: DC | PRN
  Administered 2023-03-03: 2 mL

## 2023-03-03 MED ORDER — MOXIFLOXACIN HCL 0.5 % OP SOLN
OPHTHALMIC | Status: DC | PRN
  Administered 2023-03-03: .2 mL via OPHTHALMIC

## 2023-03-03 MED ORDER — TETRACAINE HCL 0.5 % OP SOLN
OPHTHALMIC | Status: AC
  Filled 2023-03-03: qty 4

## 2023-03-03 MED ORDER — BRIMONIDINE TARTRATE-TIMOLOL 0.2-0.5 % OP SOLN
OPHTHALMIC | Status: DC | PRN
  Administered 2023-03-03: 1 [drp] via OPHTHALMIC

## 2023-03-03 MED ORDER — FENTANYL CITRATE (PF) 100 MCG/2ML IJ SOLN
INTRAMUSCULAR | Status: DC | PRN
  Administered 2023-03-03: 100 ug via INTRAVENOUS

## 2023-03-03 MED ORDER — ARMC OPHTHALMIC DILATING DROPS
OPHTHALMIC | Status: AC
  Filled 2023-03-03: qty 0.5

## 2023-03-03 MED ORDER — FENTANYL CITRATE (PF) 100 MCG/2ML IJ SOLN
INTRAMUSCULAR | Status: AC
  Filled 2023-03-03: qty 2

## 2023-03-03 MED ORDER — TETRACAINE HCL 0.5 % OP SOLN
1.0000 [drp] | OPHTHALMIC | Status: AC
  Administered 2023-03-03 (×3): 1 [drp] via OPHTHALMIC

## 2023-03-03 MED ORDER — SIGHTPATH DOSE#1 BSS IO SOLN
INTRAOCULAR | Status: DC | PRN
  Administered 2023-03-03: 45 mL via OPHTHALMIC

## 2023-03-03 MED ORDER — MIDAZOLAM HCL 2 MG/2ML IJ SOLN
INTRAMUSCULAR | Status: AC
  Filled 2023-03-03: qty 2

## 2023-03-03 MED ORDER — LACTATED RINGERS IV SOLN: INTRAVENOUS | Status: DC

## 2023-03-03 MED ORDER — MIDAZOLAM HCL 2 MG/2ML IJ SOLN
INTRAMUSCULAR | Status: DC | PRN
  Administered 2023-03-03: 2 mg via INTRAVENOUS

## 2023-03-03 MED ORDER — SIGHTPATH DOSE#1 NA CHONDROIT SULF-NA HYALURON 40-17 MG/ML IO SOLN
INTRAOCULAR | Status: DC | PRN
  Administered 2023-03-03: 1 mL via INTRAOCULAR

## 2023-03-03 MED ORDER — ARMC OPHTHALMIC DILATING DROPS
1.0000 | OPHTHALMIC | Status: AC
  Administered 2023-03-03 (×3): 1 via OPHTHALMIC

## 2023-03-03 SURGICAL SUPPLY — 12 items
ANGLE REVERSE CUT SHRT 25GA (CUTTER) ×1
CANNULA ANT/CHMB 27G (MISCELLANEOUS) IMPLANT
CANNULA ANT/CHMB 27GA (MISCELLANEOUS)
CATARACT SUITE SIGHTPATH (MISCELLANEOUS) ×1
CYSTOTOME ANGL RVRS SHRT 25G (CUTTER) ×1 IMPLANT
FEE CATARACT SUITE SIGHTPATH (MISCELLANEOUS) ×1 IMPLANT
GLOVE BIOGEL PI IND STRL 8 (GLOVE) ×1 IMPLANT
GLOVE SURG LX STRL 8.0 MICRO (GLOVE) ×1 IMPLANT
LENS IOL TECNIS EYHANCE 20.5 (Intraocular Lens) IMPLANT
NDL FILTER BLUNT 18X1 1/2 (NEEDLE) ×1 IMPLANT
NEEDLE FILTER BLUNT 18X1 1/2 (NEEDLE) ×1
SYR 3ML LL SCALE MARK (SYRINGE) ×1 IMPLANT

## 2023-03-03 NOTE — H&P (Signed)
[facility]   Primary Care Physician:  Danella Penton, MD Ophthalmologist: Dr. Druscilla Brownie  Pre-Procedure History & Physical: HPI:  Jodi Williamson is a 72 y.o. female here for cataract surgery.   Past Medical History:  Diagnosis Date   Hx of exposure to tuberculosis    received treatment 05/2021   Psoriatic arthritis [facility])     Past Surgical History:  Procedure Laterality Date   ABDOMINAL HYSTERECTOMY     BREAST EXCISIONAL BIOPSY Bilateral 1983   neg   TONSILLECTOMY      Prior to Admission medications   Medication Sig Start Date End Date Taking? Authorizing Provider  Cholecalciferol 50 MCG (2000 UT) CAPS Take by mouth.   Yes [provider]  Cyanocobalamin 5000 MCG SUBL Place under the tongue once a week. 12/24/17  Yes [provider]  estradiol (ESTRACE) 1 MG tablet Take 0.5 mg by mouth daily. 07/25/20  Yes [provider]  folic acid (FOLVITE) 1 MG tablet Take 1 mg by mouth daily. 08/09/20  Yes [provider]  furosemide (LASIX) 20 MG tablet Take 30 mg by mouth daily. 02/22/20  Yes [provider]  ibuprofen (ADVIL) 200 MG tablet Take 800 mg by mouth as needed.   Yes [provider]  ipratropium (ATROVENT) 0.03 % nasal spray USE 2 SPRAY(S) IN EACH NOSTRIL THREE TIMES DAILY AS NEEDED FOR RUNNY NOSE 08/10/20  Yes [provider]  triamcinolone cream (KENALOG) 0.1 % Apply 1 Application topically as needed.   Yes [provider]  methotrexate (RHEUMATREX) 2.5 MG tablet Take 15 mg by mouth once a week. Fridays 08/09/20   [provider]    Allergies as of 02/11/2023 - Review Complete 09/18/2021  Allergen Reaction Noted   Etodolac Itching 07/12/2019   Prednisone  08/07/2020   Penicillins Rash 12/03/2013    Family History  Problem Relation Age of Onset   Breast cancer Neg Hx     Social History   Socioeconomic History   Marital status: Married    Spouse name: Not on file   Number of children: Not  on file   Years of education: Not on file   Highest education level: Not on file  Occupational History   Not on file  Tobacco Use   Smoking status: Never   Smokeless tobacco: Never  Vaping Use   Vaping status: Never Used  Substance and Sexual Activity   Alcohol use: Never   Drug use: Never   Sexual activity: Not on file  Other Topics Concern   Not on file  Social History Narrative   Not on file   Social Determinants of Health   Financial Resource Strain: Not on file  Food Insecurity: Not on file  Transportation Needs: Not on file  Physical Activity: Not on file  Stress: Not on file  Social Connections: Not on file  Intimate Partner Violence: Not on file    Review of Systems: See HPI, otherwise negative ROS  Physical Exam: BP (!) 147/73   Temp (!) 97 F (36.1 C) (Temporal)   Ht 5' 2.99" (1.6 m)   Wt 62.1 kg   SpO2 99%   BMI 24.24 kg/m  General:   Alert, cooperative in NAD Head:  Normocephalic and atraumatic. Respiratory:  Normal work of breathing. Cardiovascular:  RRR  Impression/Plan: Jodi Williamson is here for cataract surgery.  Risks, benefits, limitations, and alternatives regarding cataract surgery have been reviewed with the patient.  Questions have been answered.  All parties agreeable.   Galen Manila, MD  03/03/2023, 10:57 AM

## 2023-03-03 NOTE — Op Note (Signed)
PREOPERATIVE DIAGNOSIS:  Nuclear sclerotic cataract of the left eye.   POSTOPERATIVE DIAGNOSIS:  Nuclear sclerotic cataract of the left eye.   OPERATIVE PROCEDURE:ORPROCALL@   SURGEON:  Galen Manila, MD.   ANESTHESIA:  Anesthesiologist: Marisue Humble, MD CRNA: Domenic Moras, CRNA  1.      Managed anesthesia care. 2.     0.60ml of Shugarcaine was instilled following the paracentesis   COMPLICATIONS:  None.   TECHNIQUE:   Stop and chop   DESCRIPTION OF PROCEDURE:  The patient was examined and consented in the preoperative holding area where the aforementioned topical anesthesia was applied to the left eye and then brought back to the Operating Room where the left eye was prepped and draped in the usual sterile ophthalmic fashion and a lid speculum was placed. A paracentesis was created with the side port blade and the anterior chamber was filled with viscoelastic. A near clear corneal incision was performed with the steel keratome. A continuous curvilinear capsulorrhexis was performed with a cystotome followed by the capsulorrhexis forceps. Hydrodissection and hydrodelineation were carried out with BSS on a blunt cannula. The lens was removed in a stop and chop  technique and the remaining cortical material was removed with the irrigation-aspiration handpiece. The capsular bag was inflated with viscoelastic and the Technis ZCB00 lens was placed in the capsular bag without complication. The remaining viscoelastic was removed from the eye with the irrigation-aspiration handpiece. The wounds were hydrated. The anterior chamber was flushed with BSS and the eye was inflated to physiologic pressure. 0.68ml Vigamox was placed in the anterior chamber. The wounds were found to be water tight. The eye was dressed with Combigan. The patient was given protective glasses to wear throughout the day and a shield with which to sleep tonight. The patient was also given drops with which to begin a drop  regimen today and will follow-up with me in one day. Implant Name Type Inv. Item Serial No. Manufacturer Lot No. LRB No. Used Action  LENS IOL TECNIS EYHANCE 20.5 - [id number] Intraocular Lens LENS IOL TECNIS EYHANCE 20.5 [id number] [employer]  Left 1 Implanted    Procedure(s): CATARACT EXTRACTION PHACO AND INTRAOCULAR LENS PLACEMENT (IOC) LEFT 5.58 00:39.1 (Left)  Electronically signed: Galen Manila 03/03/2023 11:22 AM

## 2023-03-03 NOTE — Transfer of Care (Signed)
Immediate Anesthesia Transfer of Care Note  Patient: Jodi Williamson  Procedure(s) Performed: CATARACT EXTRACTION PHACO AND INTRAOCULAR LENS PLACEMENT (IOC) LEFT 5.58 00:39.1 (Left: Eye)  Patient Location: PACU  Anesthesia Type: MAC  Level of Consciousness: awake, alert  and patient cooperative  Airway and Oxygen Therapy: Patient Spontanous Breathing and Patient connected to supplemental oxygen  Post-op Assessment: Post-op Vital signs reviewed, Patient's Cardiovascular Status Stable, Respiratory Function Stable, Patent Airway and No signs of Nausea or vomiting  Post-op Vital Signs: Reviewed and stable  Complications: No notable events documented.

## 2023-03-03 NOTE — Anesthesia Postprocedure Evaluation (Signed)
Anesthesia Post Note  Patient: Jodi Williamson  Procedure(s) Performed: CATARACT EXTRACTION PHACO AND INTRAOCULAR LENS PLACEMENT (IOC) LEFT 5.58 00:39.1 (Left: Eye)  Patient location during evaluation: PACU Anesthesia Type: MAC Level of consciousness: awake and alert Pain management: pain level controlled Vital Signs Assessment: post-procedure vital signs reviewed and stable Respiratory status: spontaneous breathing, nonlabored ventilation, respiratory function stable and patient connected to nasal cannula oxygen Cardiovascular status: stable and blood pressure returned to baseline Postop Assessment: no apparent nausea or vomiting Anesthetic complications: no   No notable events documented.   Last Vitals:  Vitals:   03/03/23 1125 03/03/23 1131  BP: (!) 98/47 (!) 106/93  Pulse: 64 65  Resp: 10 15  Temp:  36.6 C  SpO2: 100% 100%    Last Pain:  Vitals:   03/03/23 1131  TempSrc:   PainSc: 0-No pain                 Auriella Wieand C Jarae Nemmers

## 2023-03-05 ENCOUNTER — Encounter: Payer: Self-pay | Admitting: Ophthalmology

## 2023-03-12 NOTE — Discharge Instructions (Signed)

## 2023-03-17 ENCOUNTER — Other Ambulatory Visit: Payer: Self-pay

## 2023-03-17 ENCOUNTER — Ambulatory Visit: Payer: Medicare HMO | Admitting: Anesthesiology

## 2023-03-17 ENCOUNTER — Encounter: Payer: Self-pay | Admitting: Ophthalmology

## 2023-03-17 ENCOUNTER — Ambulatory Visit
Admission: RE | Admit: 2023-03-17 | Discharge: 2023-03-17 | Disposition: A | Discharge status: Home / Self Care | Payer: Medicare HMO | Attending: Ophthalmology | Admitting: Ophthalmology

## 2023-03-17 ENCOUNTER — Encounter
Admission: RE | Disposition: A | Discharge status: Home / Self Care | Payer: Self-pay | Source: Home / Self Care | Attending: Ophthalmology

## 2023-03-17 DIAGNOSIS — H2511 Age-related nuclear cataract, right eye: Secondary | ICD-10-CM | POA: Diagnosis present

## 2023-03-17 DIAGNOSIS — L4050 Arthropathic psoriasis, unspecified: Secondary | ICD-10-CM | POA: Diagnosis not present

## 2023-03-17 HISTORY — PX: CATARACT EXTRACTION W/PHACO: SHX586

## 2023-03-17 SURGERY — PHACOEMULSIFICATION, CATARACT, WITH IOL INSERTION
Anesthesia: Monitor Anesthesia Care | Site: Eye | Laterality: Right

## 2023-03-17 MED ORDER — BRIMONIDINE TARTRATE-TIMOLOL 0.2-0.5 % OP SOLN
OPHTHALMIC | Status: DC | PRN
  Administered 2023-03-17: 1 [drp] via OPHTHALMIC

## 2023-03-17 MED ORDER — ARMC OPHTHALMIC DILATING DROPS
1.0000 | OPHTHALMIC | Status: AC
  Administered 2023-03-17 (×2): 1 via OPHTHALMIC

## 2023-03-17 MED ORDER — ARMC OPHTHALMIC DILATING DROPS
OPHTHALMIC | Status: AC
  Filled 2023-03-17: qty 0.5

## 2023-03-17 MED ORDER — TETRACAINE HCL 0.5 % OP SOLN
OPHTHALMIC | Status: AC
  Filled 2023-03-17: qty 4

## 2023-03-17 MED ORDER — SIGHTPATH DOSE#1 NA CHONDROIT SULF-NA HYALURON 40-17 MG/ML IO SOLN
INTRAOCULAR | Status: DC | PRN
  Administered 2023-03-17: 1 mL via INTRAOCULAR

## 2023-03-17 MED ORDER — SIGHTPATH DOSE#1 BSS IO SOLN
INTRAOCULAR | Status: DC | PRN
  Administered 2023-03-17: 15 mL

## 2023-03-17 MED ORDER — TETRACAINE HCL 0.5 % OP SOLN
1.0000 [drp] | OPHTHALMIC | Status: AC
  Administered 2023-03-17 (×3): 1 [drp] via OPHTHALMIC

## 2023-03-17 MED ORDER — MIDAZOLAM HCL 2 MG/2ML IJ SOLN
INTRAMUSCULAR | Status: AC
  Filled 2023-03-17: qty 2

## 2023-03-17 MED ORDER — SIGHTPATH DOSE#1 BSS IO SOLN
INTRAOCULAR | Status: DC | PRN
  Administered 2023-03-17: 55 mL via OPHTHALMIC

## 2023-03-17 MED ORDER — MIDAZOLAM HCL 2 MG/2ML IJ SOLN
INTRAMUSCULAR | Status: DC | PRN
  Administered 2023-03-17: 2 mg via INTRAVENOUS

## 2023-03-17 MED ORDER — SIGHTPATH DOSE#1 BSS IO SOLN
INTRAOCULAR | Status: DC | PRN
  Administered 2023-03-17: 1 mL

## 2023-03-17 MED ORDER — MOXIFLOXACIN HCL 0.5 % OP SOLN
OPHTHALMIC | Status: DC | PRN
  Administered 2023-03-17: .2 mL via OPHTHALMIC

## 2023-03-17 SURGICAL SUPPLY — 16 items
ANGLE REVERSE CUT SHRT 25GA (CUTTER) ×1
CANNULA ANT/CHMB 27G (MISCELLANEOUS) IMPLANT
CANNULA ANT/CHMB 27GA (MISCELLANEOUS)
CATARACT SUITE SIGHTPATH (MISCELLANEOUS) ×1
CYSTOTOME ANGL RVRS SHRT 25G (CUTTER) ×1 IMPLANT
FEE CATARACT SUITE SIGHTPATH (MISCELLANEOUS) ×1 IMPLANT
GLOVE BIOGEL PI IND STRL 8 (GLOVE) ×1 IMPLANT
GLOVE SURG LX STRL 8.0 MICRO (GLOVE) ×1 IMPLANT
LENS IOL TECNIS EYHANCE 19.5 (Intraocular Lens) IMPLANT
NDL FILTER BLUNT 18X1 1/2 (NEEDLE) ×1 IMPLANT
NEEDLE FILTER BLUNT 18X1 1/2 (NEEDLE) ×1
PACK VIT ANT 23G (MISCELLANEOUS) IMPLANT
RING MALYGIN (MISCELLANEOUS) IMPLANT
SUT ETHILON 10-0 CS-B-6CS-B-6 (SUTURE)
SUTURE EHLN 10-0 CS-B-6CS-B-6 (SUTURE) IMPLANT
SYR 3ML LL SCALE MARK (SYRINGE) ×1 IMPLANT

## 2023-03-17 NOTE — Anesthesia Postprocedure Evaluation (Signed)
Anesthesia Post Note  Patient: Jodi Williamson  Procedure(s) Performed: CATARACT EXTRACTION PHACO AND INTRAOCULAR LENS PLACEMENT (IOC) RIGHT (Right: Eye)  Patient location during evaluation: PACU Anesthesia Type: MAC Level of consciousness: awake and alert Pain management: pain level controlled Vital Signs Assessment: post-procedure vital signs reviewed and stable Respiratory status: spontaneous breathing, nonlabored ventilation, respiratory function stable and patient connected to nasal cannula oxygen Cardiovascular status: blood pressure returned to baseline and stable Postop Assessment: no apparent nausea or vomiting Anesthetic complications: no  No notable events documented.   Last Vitals:  Vitals:   03/17/23 1025 03/17/23 1030  BP: (!) 83/49 (!) 105/49  Pulse: 66 63  Resp: 11 17  Temp:  (!) 36.3 C  SpO2: 100% 99%    Last Pain:  Vitals:   03/17/23 1030  TempSrc:   PainSc: 0-No pain                 Stephanie Coup

## 2023-03-17 NOTE — Op Note (Signed)
PREOPERATIVE DIAGNOSIS:  Nuclear sclerotic cataract of the right eye.   POSTOPERATIVE DIAGNOSIS:  Cataract   OPERATIVE PROCEDURE:ORPROCALL@   SURGEON:  Galen Manila, MD.   ANESTHESIA:  Anesthesiologist: Stephanie Coup, MD CRNA: Emeterio Reeve, CRNA  1.      Managed anesthesia care. 2.      0.79ml of Shugarcaine was instilled in the eye following the paracentesis.   COMPLICATIONS:  None.   TECHNIQUE:   Stop and chop   DESCRIPTION OF PROCEDURE:  The patient was examined and consented in the preoperative holding area where the aforementioned topical anesthesia was applied to the right eye and then brought back to the Operating Room where the right eye was prepped and draped in the usual sterile ophthalmic fashion and a lid speculum was placed. A paracentesis was created with the side port blade and the anterior chamber was filled with viscoelastic. A near clear corneal incision was performed with the steel keratome. A continuous curvilinear capsulorrhexis was performed with a cystotome followed by the capsulorrhexis forceps. Hydrodissection and hydrodelineation were carried out with BSS on a blunt cannula. The lens was removed in a stop and chop  technique and the remaining cortical material was removed with the irrigation-aspiration handpiece. The capsular bag was inflated with viscoelastic and the Technis [id number]  lens was placed in the capsular bag without complication. The remaining viscoelastic was removed from the eye with the irrigation-aspiration handpiece. The wounds were hydrated. The anterior chamber was flushed with BSS and the eye was inflated to physiologic pressure. 0.62ml of Vigamox was placed in the anterior chamber. The wounds were found to be water tight. The eye was dressed with Combigan. The patient was given protective glasses to wear throughout the day and a shield with which to sleep tonight. The patient was also given drops with which to begin a drop regimen today and  will follow-up with me in one day. Implant Name Type Inv. Item Serial No. Manufacturer Lot No. LRB No. Used Action  LENS IOL TECNIS EYHANCE 19.5 - [id number] Intraocular Lens LENS IOL TECNIS EYHANCE 19.5 [id number] [employer]  Right 1 Implanted   Procedure(s) with comments: CATARACT EXTRACTION PHACO AND INTRAOCULAR LENS PLACEMENT (IOC) RIGHT (Right) - 5.23 0:37.8  Electronically signed: Galen Manila 03/17/2023 10:23 AM

## 2023-03-17 NOTE — Transfer of Care (Signed)
Immediate Anesthesia Transfer of Care Note  Patient: Jodi Williamson  Procedure(s) Performed: CATARACT EXTRACTION PHACO AND INTRAOCULAR LENS PLACEMENT (IOC) RIGHT (Right: Eye)  Patient Location: PACU  Anesthesia Type: MAC  Level of Consciousness: awake, alert  and patient cooperative  Airway and Oxygen Therapy: Patient Spontanous Breathing and Patient connected to supplemental oxygen  Post-op Assessment: Post-op Vital signs reviewed, Patient's Cardiovascular Status Stable, Respiratory Function Stable, Patent Airway and No signs of Nausea or vomiting  Post-op Vital Signs: Reviewed and stable  Complications: No notable events documented.

## 2023-03-17 NOTE — Anesthesia Preprocedure Evaluation (Signed)
Anesthesia Evaluation  Patient identified by MRN, date of birth, ID band Patient awake    Reviewed: Allergy & Precautions, H&P , NPO status , Patient's Chart, lab work & pertinent test results  Airway Mallampati: III  TM Distance: >3 FB Neck ROM: Full    Dental no notable dental hx. (+) Caps   Pulmonary neg pulmonary ROS   Pulmonary exam normal breath sounds clear to auscultation       Cardiovascular negative cardio ROS Normal cardiovascular exam Rhythm:Regular Rate:Normal     Neuro/Psych  Neuromuscular disease negative neurological ROS  negative psych ROS   GI/Hepatic negative GI ROS, Neg liver ROS,,,  Endo/Other  negative endocrine ROS    Renal/GU negative Renal ROS  negative genitourinary   Musculoskeletal negative musculoskeletal ROS (+) Arthritis ,    Abdominal   Peds negative pediatric ROS (+) Delivery details - Hematology negative hematology ROS (+)   Anesthesia Other Findings Psoriatic arthritis 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  Reproductive/Obstetrics negative OB ROS                             Anesthesia Physical Anesthesia Plan  ASA: 2  Anesthesia Plan: MAC   Post-op Pain Management:    Induction: Intravenous  PONV Risk Score and Plan:   Airway Management Planned: Natural Airway and Nasal Cannula  Additional Equipment:   Intra-op Plan:   Post-operative Plan:   Informed Consent: I have reviewed the patients History and Physical, chart, labs and discussed the procedure including the risks, benefits and alternatives for the proposed anesthesia with the patient or authorized representative who has indicated his/her understanding and acceptance.     Dental Advisory Given  Plan Discussed with: Anesthesiologist, CRNA and Surgeon  Anesthesia Plan Comments: (Patient consented for risks of anesthesia including but not limited to:  - adverse reactions to medications - damage to  eyes, teeth, lips or other oral mucosa - nerve damage due to positioning  - sore throat or hoarseness - Damage to heart, brain, nerves, lungs, other parts of body or loss of life  Patient voiced understanding.)       Anesthesia Quick Evaluation

## 2023-03-17 NOTE — H&P (Signed)
[facility]   Primary Care Physician:  Danella Penton, MD Ophthalmologist: Dr. Druscilla Brownie  Pre-Procedure History & Physical: HPI:  Jodi Williamson is a 72 y.o. female here for cataract surgery.   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 Authorizing Provider  Cholecalciferol 50 MCG (2000 UT) CAPS Take by mouth.   Yes [provider]  Cyanocobalamin 5000 MCG SUBL Place under the tongue once a week. 12/24/17  Yes [provider]  estradiol (ESTRACE) 1 MG tablet Take 0.5 mg by mouth daily. 07/25/20  Yes [provider]  folic acid (FOLVITE) 1 MG tablet Take 1 mg by mouth daily. 08/09/20  Yes [provider]  furosemide (LASIX) 20 MG tablet Take 30 mg by mouth daily. 02/22/20  Yes [provider]  ibuprofen (ADVIL) 200 MG tablet Take 800 mg by mouth as needed.   Yes [provider]  ipratropium (ATROVENT) 0.03 % nasal spray USE 2 SPRAY(S) IN EACH NOSTRIL THREE TIMES DAILY AS NEEDED FOR RUNNY NOSE 08/10/20  Yes [provider]  methotrexate (RHEUMATREX) 2.5 MG tablet Take 15 mg by mouth once a week. Fridays 08/09/20  Yes [provider]  triamcinolone cream (KENALOG) 0.1 % Apply 1 Application topically as needed.   Yes [provider]    Allergies as of 02/11/2023 - Review Complete 09/18/2021  Allergen Reaction Noted   Etodolac Itching 07/12/2019    Prednisone  08/07/2020   Penicillins Rash 12/03/2013    Family History  Problem Relation Age of Onset   Breast cancer Neg Hx     Social History   Socioeconomic History   Marital status: Married    Spouse name: Not on file   Number of children: Not on file   Years of education: Not on file   Highest education level: Not on file  Occupational History   Not on file  Tobacco Use   Smoking status: Never   Smokeless tobacco: Never  Vaping Use   Vaping status: Never Used  Substance and Sexual Activity   Alcohol use: Never   Drug use: Never   Sexual activity: Not on file  Other Topics Concern   Not on file  Social History Narrative   Not on file   Social Determinants of Health   Financial Resource Strain: Not on file  Food Insecurity: Not on file  Transportation Needs: Not on file  Physical Activity: Not on file  Stress: Not on file  Social Connections: Not on file  Intimate Partner Violence: Not on file    Review of Systems: See HPI, otherwise negative ROS  Physical Exam: BP 123/67   Pulse 61   Temp 97.7 F (36.5 C) (Temporal)   Resp 18   Ht 5\' 3"  (1.6 m)   Wt 61.7 kg   SpO2 100%   BMI 24.09 kg/m  General:   Alert, cooperative in NAD Head:  Normocephalic and atraumatic. Respiratory:  Normal work of breathing. Cardiovascular:  RRR  Impression/Plan: Jodi Williamson is here for cataract surgery.  Risks, benefits, limitations, and alternatives regarding cataract surgery have been reviewed with the patient.  Questions have been answered.  All parties agreeable.   Galen Manila, MD  03/17/2023, 10:00 AM

## 2023-03-18 ENCOUNTER — Encounter: Payer: Self-pay | Admitting: Ophthalmology

## 2023-04-15 ENCOUNTER — Ambulatory Visit
Admission: RE | Admit: 2023-04-15 | Discharge: 2023-04-15 | Disposition: A | Discharge status: Home / Self Care | Payer: Medicare HMO | Source: Ambulatory Visit | Attending: Internal Medicine | Admitting: Internal Medicine

## 2023-04-15 DIAGNOSIS — Z1231 Encounter for screening mammogram for malignant neoplasm of breast: Secondary | ICD-10-CM | POA: Diagnosis present

## 2023-05-01 ENCOUNTER — Other Ambulatory Visit: Payer: Self-pay | Admitting: Internal Medicine

## 2023-05-01 DIAGNOSIS — R7401 Elevation of levels of liver transaminase levels: Secondary | ICD-10-CM

## 2023-05-04 ENCOUNTER — Ambulatory Visit
Admission: RE | Admit: 2023-05-04 | Discharge: 2023-05-04 | Disposition: A | Discharge status: Home / Self Care | Payer: Medicare HMO | Source: Ambulatory Visit | Attending: Internal Medicine | Admitting: Internal Medicine

## 2023-05-04 DIAGNOSIS — R7401 Elevation of levels of liver transaminase levels: Secondary | ICD-10-CM | POA: Insufficient documentation

## 2024-01-20 ENCOUNTER — Encounter: Payer: Self-pay | Admitting: Internal Medicine

## 2024-01-20 DIAGNOSIS — Z1231 Encounter for screening mammogram for malignant neoplasm of breast: Secondary | ICD-10-CM

## 2024-01-28 ENCOUNTER — Other Ambulatory Visit: Payer: Self-pay | Admitting: Internal Medicine

## 2024-01-28 DIAGNOSIS — N63 Unspecified lump in unspecified breast: Secondary | ICD-10-CM

## 2024-01-29 ENCOUNTER — Ambulatory Visit
Admission: RE | Admit: 2024-01-29 | Discharge: 2024-01-29 | Disposition: A | Discharge status: Home / Self Care | Source: Ambulatory Visit | Attending: Internal Medicine | Admitting: Internal Medicine

## 2024-01-29 DIAGNOSIS — N6311 Unspecified lump in the right breast, upper outer quadrant: Secondary | ICD-10-CM | POA: Insufficient documentation

## 2024-01-29 DIAGNOSIS — N63 Unspecified lump in unspecified breast: Secondary | ICD-10-CM

## 2024-02-01 ENCOUNTER — Other Ambulatory Visit: Payer: Self-pay | Admitting: Internal Medicine

## 2024-02-01 DIAGNOSIS — R928 Other abnormal and inconclusive findings on diagnostic imaging of breast: Secondary | ICD-10-CM

## 2024-02-04 ENCOUNTER — Ambulatory Visit
Admission: RE | Admit: 2024-02-04 | Discharge: 2024-02-04 | Disposition: A | Discharge status: Home / Self Care | Source: Ambulatory Visit | Attending: Internal Medicine | Admitting: Internal Medicine

## 2024-02-04 DIAGNOSIS — R928 Other abnormal and inconclusive findings on diagnostic imaging of breast: Secondary | ICD-10-CM

## 2024-02-04 DIAGNOSIS — C50411 Malignant neoplasm of upper-outer quadrant of right female breast: Secondary | ICD-10-CM | POA: Diagnosis present

## 2024-02-04 HISTORY — PX: BREAST BIOPSY: SHX20

## 2024-02-04 MED ORDER — LIDOCAINE-EPINEPHRINE 1 %-1:100000 IJ SOLN
5.0000 mL | Freq: Once | INTRAMUSCULAR | Status: AC
  Administered 2024-02-04: 5 mL
  Filled 2024-02-04: qty 5

## 2024-02-04 MED ORDER — LIDOCAINE 1 % OPTIME INJ - NO CHARGE
1.0000 mL | Freq: Once | INTRAMUSCULAR | Status: AC
  Administered 2024-02-04: 1 mL
  Filled 2024-02-04: qty 2

## 2024-02-05 ENCOUNTER — Encounter: Payer: Self-pay | Admitting: *Deleted

## 2024-02-05 DIAGNOSIS — C50911 Malignant neoplasm of unspecified site of right female breast: Secondary | ICD-10-CM

## 2024-02-05 LAB — SURGICAL PATHOLOGY

## 2024-02-05 NOTE — Progress Notes (Signed)
Received referral for newly diagnosed breast cancer from [facility].  Navigation initiated.  Spoke with daughter Maureen to set up appointments.   She will see both med onc and surgeon tomorrow, Dr. Agrawal at 2:00 and Dr. Sakai at 10:15. 

## 2024-02-08 ENCOUNTER — Encounter: Payer: Self-pay | Admitting: *Deleted

## 2024-02-08 NOTE — Progress Notes (Addendum)
 Jodi Williamson is scheduled to see Dr. Cesar on Tues. 9/16 at 3:45 and Dr. Rennie on Wed. 9/17 at 11.

## 2024-02-10 ENCOUNTER — Inpatient Hospital Stay

## 2024-02-10 ENCOUNTER — Inpatient Hospital Stay: Attending: Internal Medicine | Admitting: Internal Medicine

## 2024-02-10 VITALS — BP 119/54 | HR 77 | Temp 98.0°F | Resp 18 | Ht 63.0 in | Wt 129.0 lb

## 2024-02-10 DIAGNOSIS — Z1721 Progesterone receptor positive status: Secondary | ICD-10-CM | POA: Insufficient documentation

## 2024-02-10 DIAGNOSIS — Z170 Estrogen receptor positive status [ER+]: Secondary | ICD-10-CM | POA: Insufficient documentation

## 2024-02-10 DIAGNOSIS — C50411 Malignant neoplasm of upper-outer quadrant of right female breast: Secondary | ICD-10-CM | POA: Insufficient documentation

## 2024-02-10 DIAGNOSIS — Z1732 Human epidermal growth factor receptor 2 negative status: Secondary | ICD-10-CM | POA: Insufficient documentation

## 2024-02-10 NOTE — Progress Notes (Signed)
 Pt felt a tender lump in her right breast. Had biopsy. Hoping she will need a lumpectomy and not mastectomy. Otherwise feels well.

## 2024-02-10 NOTE — Assessment & Plan Note (Addendum)
# [  SEP 2025- right breast pain]-   RIGHT breast at the 9:30 position : 8 cm from the nipple measuring 7 mm, correlating with the palpable;  No RIGHT axillary lymphadenopathy seen. No mammographic evidence of malignancy in the LEFT breast.  S/p Bx- RIGHT BREAST:   INVASIVE LOBULAR CARCINOMA clinical stage I T1b cN0; M0- ER-70%/PR 100; Her 2 NEG.G-1; ki 67-1% Surgeon:Dr.Cintron currently awaiting MRI breast.  # I had a long discussion with the patient in general regarding the treatment options of breast cancer including-surgery; adjuvant radiation; role of adjuvant systemic therapy including-chemotherapy antihormone therapy.     # Discussed surgical options-sentinel lymph node biopsy with lumpectomy versus mastectomy.  Discussed that lumpectomy is usually followed by adjuvant radiation.  Whereas mastectomy typically does not indicate radiation. Patient is currently s/p  evaluation with surgery-Dr.Cintron.  Patient is leaning towards lumpectomy-followed by radiation which is very reasonable.  # Discussed that given patient's presurgical clinical characteristics LESS likely that she will need chemotherapy.  However final decision regarding chemotherapy based on final surgical pathology/gene assay-again based on final pathology.  #I discussed the role of endocrine therapy-given ER/PR positive disease postsurgery.  Patient will be offered antihormone pill 1 a day for 5 years.  Discussed potential downsides including but not limited to arthralgias hot flashes and osteoporosis.  Given history of hot flashes-would definitely consider pharmacologic therapy/hormonal treatment options.  # Bone health: Patient will need BMD. Will order at next visit.   #  Genetics counseling: I at length discussed with the patient that majority of cancers are sporadic however 10 to 20% at risk of genetic/hereditary cancer syndromes.  Discussed importance of genetic counseling/genetic testing-given the therapeutic/preventative  aspects.  HOWEVER- LOW LIEKLYHOOD of any genetic couses. Reocmmend speaking to family.  If interested we will make a referral at next visit.  Thank you Dr.Miller  for allowing me to participate in the care of your pleasant patient. Please do not hesitate to contact me with questions or concerns in the interim. Follow up in 2-3 weeks after surgery. Discussed with Therisa; and also discussed with surgery, Dr.Cintron.   # DISPOSITION: # no labs today # Follow up TBD- Dr.B

## 2024-02-10 NOTE — Progress Notes (Signed)
 [facility] CONSULT NOTE  Patient Care Team: Cleotilde Oneil FALCON, MD as PCP - General (Internal Medicine) Georgina Shasta POUR, RN as Oncology Nurse Navigator Rennie Cindy SAUNDERS, MD as Consulting Physician (Oncology)  CHIEF COMPLAINTS/PURPOSE OF CONSULTATION: Breast cancer  #  Oncology History Overview Note  IMPRESSION: 1. Subtle, hypoechoic mass in the RIGHT breast at the 9:30 position 8 cm from the nipple measuring 7 mm, correlating with the palpable area of concern indicated by the patient and likely correlating with architectural distortion in this region on same-day mammogram. Further characterization with ultrasound-guided biopsy is recommended.   2.  No RIGHT axillary lymphadenopathy seen.   3.  No mammographic evidence of malignancy in the LEFT breast.     Malignant neoplasm of upper-outer quadrant of right breast in female, estrogen receptor positive (HCC)  02/10/2024 Initial Diagnosis   Malignant neoplasm of upper-outer quadrant of right breast in female, estrogen receptor positive (HCC)   02/10/2024 Cancer Staging   Staging form: Breast, AJCC 8th Edition - Clinical: Stage IA (cT1b, cN0, cM0, G1, ER+, PR+, HER2-) - Signed by Rennie Cindy SAUNDERS, MD on 02/10/2024 Histologic grading system: 3 grade system      HISTORY OF PRESENTING ILLNESS: Patient ambulating-independently.Alone.   Jodi Williamson 73 y.o.  female female with no prior history of breast cancer/or malignancies has been referred to us  for further evaluation recommendations for new diagnosis of breast cancer.  Patient noted to have mass in her right breast for a couple of months,. 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  No family history of breast cancer is reported. She denies any nipple  discharge or skin changes but notes soreness in the breast since the biopsy. Her current medications include premarin/estradiol [sec to Hot flahes], which she has been taking for about twenty years for hot flashes. She is concerned that the estradiol may have contributed to her cancer, given that her cancer is estrogen receptor positive.    Patient does not have any other signs or symptoms.  No bone pain or joint pains noted other lumps or bumps.  Family history of breast cancer/Family history of other cancers: none  Review of Systems  Constitutional:  Negative for chills, diaphoresis, fever, malaise/fatigue and weight loss.  HENT:  Negative for nosebleeds and sore throat.   Eyes:  Negative for double vision.  Respiratory:  Negative for cough, hemoptysis, sputum production, shortness of breath and wheezing.   Cardiovascular:  Negative for chest pain, palpitations, orthopnea and leg swelling.  Gastrointestinal:  Negative for abdominal pain, blood in stool, constipation, diarrhea, heartburn, melena, nausea and vomiting.  Genitourinary:  Negative for dysuria, frequency and urgency.  Musculoskeletal:  Negative for back pain and joint pain.  Skin: Negative.  Negative for itching and rash.  Neurological:  Negative for dizziness, tingling, focal weakness, weakness and headaches.  Endo/Heme/Allergies:  Does not bruise/bleed easily.  Psychiatric/Behavioral:  Negative for depression. The patient is not nervous/anxious and does not have insomnia.      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   Psoriatic arthritis [facility])     SURGICAL HISTORY: Past Surgical History:  Procedure Laterality Date   ABDOMINAL HYSTERECTOMY     BREAST BIOPSY Right 02/04/2024  US  RT BREAST BX W LOC DEV 1ST LESION IMG BX SPEC US  GUIDE 02/04/2024 [facility]   BREAST EXCISIONAL BIOPSY Bilateral 1983   neg   CATARACT EXTRACTION W/PHACO Left  03/03/2023   Procedure: CATARACT EXTRACTION PHACO AND INTRAOCULAR LENS PLACEMENT (IOC) LEFT 5.58 00:39.1;  Surgeon: Jaye Fallow, MD;  Location: [facility];  Service: Ophthalmology;  Laterality: Left;   CATARACT EXTRACTION W/PHACO Right 03/17/2023   Procedure: CATARACT EXTRACTION PHACO AND INTRAOCULAR LENS PLACEMENT (IOC) RIGHT;  Surgeon: Jaye Fallow, MD;  Location: [facility];  Service: Ophthalmology;  Laterality: Right;  5.23 0:37.8   TONSILLECTOMY      SOCIAL HISTORY: Social History   Socioeconomic History   Marital status: Married    Spouse name: Not on file   Number of children: Not on file   Years of education: Not on file   Highest education level: Not on file  Occupational History   Not on file  Tobacco Use   Smoking status: Never   Smokeless tobacco: Never  Vaping Use   Vaping status: Never Used  Substance and Sexual Activity   Alcohol use: Never   Drug use: Never   Sexual activity: Not on file  Other Topics Concern   Not on file  Social History Narrative   Not on file   Social Drivers of Health   Financial Resource Strain: Low Risk  (05/01/2023)   Received [facility]   Overall Financial Resource Strain (CARDIA)    Difficulty of Paying Living Expenses: Not hard at all  Food Insecurity: No Food Insecurity (02/10/2024)   Hunger Vital Sign    Worried About Running Out of Food in the Last Year: Never true    Ran Out of Food in the Last Year: Never true  Transportation Needs: No Transportation Needs (02/10/2024)   PRAPARE - Administrator, Civil Service (Medical): No    Lack of Transportation (Non-Medical): No  Physical Activity: Not on file  Stress: Not on file  Social Connections: Not on file  Intimate Partner Violence: Not At Risk (02/10/2024)   Humiliation, Afraid, Rape, and Kick questionnaire    Fear of Current or Ex-Partner: No    Emotionally Abused: No    Physically Abused: No    Sexually  Abused: No    FAMILY HISTORY: Family History  Problem Relation Age of Onset   Breast cancer Neg Hx     ALLERGIES:  is allergic to etodolac and penicillins.  MEDICATIONS:  Current Outpatient Medications  Medication Sig Dispense Refill   Apremilast (OTEZLA) 30 MG TABS Take 1 tablet by mouth 2 (two) times daily.     Cholecalciferol 50 MCG (2000 UT) CAPS Take by mouth.     Cyanocobalamin 5000 MCG SUBL Place under the tongue once a week.     furosemide (LASIX) 20 MG tablet Take 30 mg by mouth daily.     ibuprofen (ADVIL) 200 MG tablet Take 800 mg by mouth as needed.     ipratropium (ATROVENT) 0.03 % nasal spray USE 2 SPRAY(S) IN EACH NOSTRIL THREE TIMES DAILY AS NEEDED FOR RUNNY NOSE     triamcinolone cream (KENALOG) 0.1 % Apply 1 Application topically as needed.     estradiol (ESTRACE) 1 MG tablet Take 0.5 mg by mouth daily. (Patient not taking: Reported on 02/10/2024)     No current facility-administered medications for this visit.      PHYSICAL EXAMINATION:   Vitals:   02/10/24 1107  BP: ROLLEN)  119/54  Pulse: 77  Resp: 18  Temp: 98 F (36.7 C)  SpO2: 99%   Filed Weights   02/10/24 1059  Weight: 129 lb (58.5 kg)    Physical Exam Vitals and nursing note reviewed.  HENT:     Head: Normocephalic and atraumatic.     Mouth/Throat:     Pharynx: Oropharynx is clear.  Eyes:     Extraocular Movements: Extraocular movements intact.     Pupils: Pupils are equal, round, and reactive to light.  Cardiovascular:     Rate and Rhythm: Normal rate and regular rhythm.  Pulmonary:     Comments: Decreased breath sounds bilaterally.  Abdominal:     Palpations: Abdomen is soft.  Musculoskeletal:        General: Normal range of motion.     Cervical back: Normal range of motion.  Skin:    General: Skin is warm.  Neurological:     General: No focal deficit present.     Mental Status: She is alert and oriented to person, place, and time.  Psychiatric:        Behavior: Behavior  normal.        Judgment: Judgment normal.      LABORATORY DATA:  I have reviewed the data as listed Lab Results  Component Value Date   WBC 7.8 06/24/2021   HGB 12.0 06/24/2021   HCT 36.1 06/24/2021   MCV 98 (H) 06/24/2021   PLT 265 06/24/2021   No results for input(s): NA, K, CL, CO2, GLUCOSE, BUN, CREATININE, CALCIUM, GFRNONAA, GFRAA, PROT, ALBUMIN, AST, ALT, ALKPHOS, BILITOT, BILIDIR, IBILI in the last 8760 hours.  RADIOGRAPHIC STUDIES: I have personally reviewed the radiological images as listed and agreed with the findings in the report. US  RT BREAST BX W LOC DEV 1ST LESION IMG BX SPEC US  GUIDE Addendum Date: 02/06/2024 ADDENDUM REPORT: 02/05/2024 22:05 ADDENDUM: PATHOLOGY revealed: 1. Breast, right, needle core biopsy, 9:30 8 cm FN, ribbon- INVASIVE LOBULAR CARCINOMA OVERALL GRADE: 1. LYMPHOVASCULAR INVASION: NOT IDENTIFIED, CANCER LENGTH: 1.8 MM / 0.18 CM, MULTIFOCAL CALCIFICATIONS: NOT IDENTIFIED Pathology results are CONCORDANT with imaging findings, per Dr. Corean Salter. Pathology results and recommendations were discussed with patient via telephone on 02/05/24 by Rock Hover RN. Patient reported biopsy site doing well with no adverse symptoms, and only slight tenderness at the site. Post biopsy care instructions were reviewed, questions were answered and my direct phone number was provided. Patient was instructed to call [facility] for any additional questions or concerns related to biopsy site. RECOMMENDATIONS: 1. Surgical and oncological consultation. Request for surgical and oncological consultation relayed to Shasta Ada RN at [facility] by Rock Hover RN on 02/05/24. 2. Recommend pretreatment bilateral breast MRI with and without contrast to determine extent of breast disease given lobular features and breast density. Pathology results reported by Rock Hover RN on 02/05/2024. Electronically Signed   By: Corean Salter M.D.   On: 02/05/2024 22:05   Result Date: 02/05/2024 CLINICAL DATA:  Indeterminate RIGHT breast area at 9:30 EXAM: ULTRASOUND GUIDED RIGHT BREAST CORE NEEDLE BIOPSY COMPARISON:  Previous exam(s). PROCEDURE: I met with the patient and we discussed the procedure of ultrasound-guided biopsy, including benefits and alternatives. We discussed the high likelihood of a successful procedure. We discussed the risks of the procedure, including infection, bleeding, tissue injury, clip migration, and inadequate sampling. Informed written consent was given. The usual time-out protocol was performed immediately prior to the procedure. Lesion quadrant: Upper outer quadrant  Using sterile technique and 1% lidocaine  and 1% lidocaine  with epinephrine  as local anesthetic, under direct ultrasound visualization, a 14 gauge spring-loaded device was used to perform biopsy of possible distortion at 9:30 8 cm from the nipple using a lateral approach. At the conclusion of the procedure a RIBBON shaped tissue marker clip was deployed into the biopsy cavity. Follow up 2 view mammogram was performed and dictated separately. IMPRESSION: Ultrasound guided biopsy of the RIGHT breast at 9:30 8 cm from the nipple. No apparent complications. Electronically Signed: By: Corean Salter M.D. On: 02/04/2024 09:48   MM CLIP PLACEMENT RIGHT Result Date: 02/04/2024 CLINICAL DATA:  Status post ultrasound-guided biopsy EXAM: 3D DIAGNOSTIC RIGHT MAMMOGRAM POST ULTRASOUND BIOPSY COMPARISON:  Previous exam(s). ACR Breast Density Category c: The breasts are heterogeneously dense, which may obscure small masses. FINDINGS: 3D Mammographic images were obtained following ultrasound guided biopsy of the RIGHT breast at 9:30. The RIBBON biopsy marking clip is in expected position at the site of biopsy. IMPRESSION: Appropriate positioning of the RIBBON shaped biopsy marking clip at the site of biopsy in the upper outer breast. Final Assessment: Post  Procedure Mammograms for Marker Placement Electronically Signed   By: Corean Salter M.D.   On: 02/04/2024 09:47   MM 3D DIAGNOSTIC MAMMOGRAM BILATERAL BREAST Result Date: 01/29/2024 CLINICAL DATA:  RIGHT breast lump x3 months at 9 o'clock. Due for annual. EXAM: DIGITAL DIAGNOSTIC BILATERAL MAMMOGRAM WITH TOMOSYNTHESIS AND CAD; ULTRASOUND RIGHT BREAST LIMITED TECHNIQUE: Bilateral digital diagnostic mammography and breast tomosynthesis was performed. The images were evaluated with computer-aided detection. ; Targeted ultrasound examination of the right breast was performed COMPARISON:  Previous exam(s). ACR Breast Density Category c: The breasts are heterogeneously dense, which may obscure small masses. FINDINGS: There is no mammographic evidence of malignancy in the LEFT breast. Spot compression tomosynthesis views were obtained over the palpable area of concern in the RIGHT breast. There is underlying architectural distortion at the site of palpable concern. Ultrasound was performed. Targeted RIGHT breast ultrasound was performed in the palpable area of concern at 930, 8 cm from the nipple. At the site of palpable concern there is a 7 x 4 x 6 mm taller than wide, subtle hypoechoic mass with indistinct margins which is favored to correlate with the architectural distortion. RIGHT axillary ultrasound was performed without suspicious lymphadenopathy. IMPRESSION: 1. Subtle, hypoechoic mass in the RIGHT breast at the 9:30 position 8 cm from the nipple measuring 7 mm, correlating with the palpable area of concern indicated by the patient and likely correlating with architectural distortion in this region on same-day mammogram. Further characterization with ultrasound-guided biopsy is recommended. 2.  No RIGHT axillary lymphadenopathy seen. 3.  No mammographic evidence of malignancy in the LEFT breast. RECOMMENDATION: Ultrasound-guided biopsy of the RIGHT breast mass at the 9:30 o'clock position. I have discussed  the findings and recommendations with the patient. The biopsy procedure was discussed with the patient and questions were answered. Patient expressed their understanding of the biopsy recommendation. Patient will be scheduled for biopsy at her earliest convenience by the schedulers. Ordering provider will be notified. If applicable, a reminder letter will be sent to the patient regarding the next appointment. BI-RADS CATEGORY  4: Suspicious. Electronically Signed   By: Norleen Croak M.D.   On: 01/29/2024 12:33   US  LIMITED ULTRASOUND INCLUDING AXILLA RIGHT BREAST Result Date: 01/29/2024 CLINICAL DATA:  RIGHT breast lump x3 months at 9 o'clock. Due for annual. EXAM: DIGITAL DIAGNOSTIC BILATERAL MAMMOGRAM WITH TOMOSYNTHESIS AND CAD;  ULTRASOUND RIGHT BREAST LIMITED TECHNIQUE: Bilateral digital diagnostic mammography and breast tomosynthesis was performed. The images were evaluated with computer-aided detection. ; Targeted ultrasound examination of the right breast was performed COMPARISON:  Previous exam(s). ACR Breast Density Category c: The breasts are heterogeneously dense, which may obscure small masses. FINDINGS: There is no mammographic evidence of malignancy in the LEFT breast. Spot compression tomosynthesis views were obtained over the palpable area of concern in the RIGHT breast. There is underlying architectural distortion at the site of palpable concern. Ultrasound was performed. Targeted RIGHT breast ultrasound was performed in the palpable area of concern at 930, 8 cm from the nipple. At the site of palpable concern there is a 7 x 4 x 6 mm taller than wide, subtle hypoechoic mass with indistinct margins which is favored to correlate with the architectural distortion. RIGHT axillary ultrasound was performed without suspicious lymphadenopathy. IMPRESSION: 1. Subtle, hypoechoic mass in the RIGHT breast at the 9:30 position 8 cm from the nipple measuring 7 mm, correlating with the palpable area of concern  indicated by the patient and likely correlating with architectural distortion in this region on same-day mammogram. Further characterization with ultrasound-guided biopsy is recommended. 2.  No RIGHT axillary lymphadenopathy seen. 3.  No mammographic evidence of malignancy in the LEFT breast. RECOMMENDATION: Ultrasound-guided biopsy of the RIGHT breast mass at the 9:30 o'clock position. I have discussed the findings and recommendations with the patient. The biopsy procedure was discussed with the patient and questions were answered. Patient expressed their understanding of the biopsy recommendation. Patient will be scheduled for biopsy at her earliest convenience by the schedulers. Ordering provider will be notified. If applicable, a reminder letter will be sent to the patient regarding the next appointment. BI-RADS CATEGORY  4: Suspicious. Electronically Signed   By: Norleen Croak M.D.   On: 01/29/2024 12:33    ASSESSMENT & PLAN:   Malignant neoplasm of upper-outer quadrant of right breast in female, estrogen receptor positive (HCC) # [SEP 2025- right breast pain]-   RIGHT breast at the 9:30 position : 8 cm from the nipple measuring 7 mm, correlating with the palpable;  No RIGHT axillary lymphadenopathy seen. No mammographic evidence of malignancy in the LEFT breast.  S/p Bx- RIGHT BREAST:   INVASIVE LOBULAR CARCINOMA clinical stage I T1b cN0; M0- ER-70%/PR 100; Her 2 NEG.G-1; ki 67-1% Surgeon:Dr.Cintron currently awaiting MRI breast.  # I had a long discussion with the patient in general regarding the treatment options of breast cancer including-surgery; adjuvant radiation; role of adjuvant systemic therapy including-chemotherapy antihormone therapy.     # Discussed surgical options-sentinel lymph node biopsy with lumpectomy versus mastectomy.  Discussed that lumpectomy is usually followed by adjuvant radiation.  Whereas mastectomy typically does not indicate radiation. Patient is currently s/p   evaluation with surgery-Dr.Cintron.  Patient is leaning towards lumpectomy-followed by radiation which is very reasonable.  # Discussed that given patient's presurgical clinical characteristics LESS likely that she will need chemotherapy.  However final decision regarding chemotherapy based on final surgical pathology/gene assay-again based on final pathology.  #I discussed the role of endocrine therapy-given ER/PR positive disease postsurgery.  Patient will be offered antihormone pill 1 a day for 5 years.  Discussed potential downsides including but not limited to arthralgias hot flashes and osteoporosis.  Given history of hot flashes-would definitely consider pharmacologic therapy/hormonal treatment options.  # Bone health: Patient will need BMD. Will order at next visit.   #  Genetics counseling: I at length discussed with  the patient that majority of cancers are sporadic however 10 to 20% at risk of genetic/hereditary cancer syndromes.  Discussed importance of genetic counseling/genetic testing-given the therapeutic/preventative aspects.  HOWEVER- LOW LIEKLYHOOD of any genetic couses. Reocmmend speaking to family.  If interested we will make a referral at next visit.  Thank you Dr.Miller  for allowing me to participate in the care of your pleasant patient. Please do not hesitate to contact me with questions or concerns in the interim. Follow up in 2-3 weeks after surgery. Discussed with Therisa; and also discussed with surgery, Dr.Cintron.   # DISPOSITION: # no labs today # Follow up TBD- Dr.B    All questions were answered. The patient/family knows to call the clinic with any problems, questions or concerns.    Cindy JONELLE Joe, MD 02/10/2024 12:45 PM

## 2024-02-16 ENCOUNTER — Other Ambulatory Visit: Payer: Self-pay | Admitting: General Surgery

## 2024-02-16 DIAGNOSIS — Z170 Estrogen receptor positive status [ER+]: Secondary | ICD-10-CM

## 2024-02-17 ENCOUNTER — Ambulatory Visit
Admission: RE | Admit: 2024-02-17 | Discharge: 2024-02-17 | Disposition: A | Discharge status: Home / Self Care | Source: Ambulatory Visit | Attending: General Surgery | Admitting: General Surgery

## 2024-02-17 DIAGNOSIS — C50411 Malignant neoplasm of upper-outer quadrant of right female breast: Secondary | ICD-10-CM | POA: Diagnosis present

## 2024-02-17 DIAGNOSIS — Z170 Estrogen receptor positive status [ER+]: Secondary | ICD-10-CM | POA: Insufficient documentation

## 2024-02-17 MED ORDER — GADOBUTROL 1 MMOL/ML IV SOLN
6.0000 mL | Freq: Once | INTRAVENOUS | Status: AC | PRN
  Administered 2024-02-17: 6 mL via INTRAVENOUS

## 2024-02-24 ENCOUNTER — Inpatient Hospital Stay: Attending: Internal Medicine

## 2024-02-24 ENCOUNTER — Encounter: Payer: Self-pay | Admitting: *Deleted

## 2024-02-24 ENCOUNTER — Other Ambulatory Visit: Payer: Self-pay | Admitting: General Surgery

## 2024-02-24 DIAGNOSIS — Z170 Estrogen receptor positive status [ER+]: Secondary | ICD-10-CM

## 2024-02-24 DIAGNOSIS — C50411 Malignant neoplasm of upper-outer quadrant of right female breast: Secondary | ICD-10-CM

## 2024-02-24 NOTE — Progress Notes (Signed)
 Multidisciplinary Oncology Council Documentation  Jodi Williamson was presented by our [facility] on 02/24/2024, which included representatives from:  Palliative Care Dietitian  Physical/Occupational Therapist Nurse Navigator Genetics Social work Survivorship RN Financial Navigator Research RN   Jodi Williamson currently presents with history of breast cancer  We reviewed previous medical and familial history, history of present illness, and recent lab results along with all available histopathologic and imaging studies. The MOC considered available treatment options and made the following recommendations/referrals:  Rehab screening  The MOC is a meeting of clinicians from various specialty areas who evaluate and discuss patients for whom a multidisciplinary approach is being considered. Final determinations in the plan of care are those of the provider(s).   Today's extended care, comprehensive team conference, Jodi Williamson was not present for the discussion and was not examined.

## 2024-02-29 ENCOUNTER — Ambulatory Visit
Admission: RE | Admit: 2024-02-29 | Discharge: 2024-02-29 | Disposition: A | Source: Ambulatory Visit | Attending: General Surgery

## 2024-02-29 ENCOUNTER — Ambulatory Visit
Admission: RE | Admit: 2024-02-29 | Discharge: 2024-02-29 | Disposition: A | Discharge status: Home / Self Care | Source: Ambulatory Visit | Attending: General Surgery | Admitting: General Surgery

## 2024-02-29 DIAGNOSIS — C50411 Malignant neoplasm of upper-outer quadrant of right female breast: Secondary | ICD-10-CM

## 2024-02-29 MED ORDER — GADOPICLENOL 0.5 MMOL/ML IV SOLN
6.0000 mL | Freq: Once | INTRAVENOUS | Status: AC | PRN
  Administered 2024-02-29: 6 mL via INTRAVENOUS

## 2024-03-01 LAB — SURGICAL PATHOLOGY

## 2024-03-02 ENCOUNTER — Ambulatory Visit: Payer: Self-pay | Admitting: General Surgery

## 2024-03-02 NOTE — H&P (View-Only) (Signed)
 History of Present Illness Jodi Williamson is a 73 year old female with invasive lobular carcinoma of the right breast who presents for discussion of surgical management.  She was diagnosed with invasive lobular carcinoma of the right breast, ER, PR positive, HER2 negative, on February 09, 2024, following a mammogram and ultrasound that revealed a 7 mm hypoechoic mass in the right breast at the 9:30 position, 8 cm from the nipple.  A bilateral breast MRI was performed due to the lobular nature of the carcinoma, identifying two additional areas of concern: a 1.4 cm enhancement anteriorly and inferiorly to the known malignancy in the outer right breast, and a 0.4 cm focus of enhancement in the central right breast. 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  She experiences soreness and redness at the biopsy sites, which she manages with ice packs. She is concerned about the potential role of estradiol in her cancer development.      PAST MEDICAL HISTORY:  Past Medical History:  Diagnosis Date  . B12 deficiency 12/18/2016   147, 7/18  . Cervicalgia 10/21/2013  . Hyperlipidemia 10/21/2013  . Plaque psoriasis 05/22/2016        PAST SURGICAL HISTORY:   Past Surgical History:  Procedure Laterality Date  . COLONOSCOPY N/A 09/01/2003   Dr. LOIS Drone @ [facility] - Nml  . COLONOSCOPY N/A 04/03/2015   Dr. FABIENE Holmes @ [facility] - polyp removed, not retrieved: [facility] 03/2020  . TOOTH EXTRACTION  11/2016  . COLONOSCOPY  11/18/2021   normal colon/no repeat/TKT  . APPENDECTOMY    . CATARACT EXTRACTION    . Hx of breast cyst surgery    . HYSTERECTOMY     one ovary  . Lasik surgery Bilateral   . TONSILLECTOMY    . Wisdom teeth extraction           MEDICATIONS:  Outpatient Encounter Medications as of 03/02/2024  Medication Sig Dispense Refill  . apremilast (OTEZLA) 30 mg tablet Take 1 tablet (30  mg total) by mouth 2 (two) times daily 60 tablet 11  . cholecalciferol (VITAMIN D3) 2,000 unit capsule Take 2,000 Units by mouth once daily.    . cyanocobalamin, vitamin B-12, 5,000 mcg Subl Place 1 tablet under the tongue once a week       . FUROsemide (LASIX) 20 MG tablet Take 1.5 tablets (30 mg total) by mouth once daily 135 tablet 3  . ipratropium (ATROVENT) 21 mcg (0.03 %) nasal spray PLACE 1 SPRAY INTO BOTH NOSTRILS 2 (TWO) TIMES DAILY AS NEEDED FOR UP TO 30 DAYS 90 mL 1  . mupirocin (BACTROBAN) 2 % ointment APPLY TWICE DAILY TO AFFECTED AREA UNTIL IMPROVED    . venlafaxine (EFFEXOR-XR) 37.5 MG XR capsule Take 1 capsule (37.5 mg total) by mouth once daily 90 capsule 3  . valACYclovir (VALTREX) 1000 MG tablet Take 1 tablet (1,000 mg total) by mouth 2 (two) times daily 14 tablet 0   No facility-administered encounter medications on file as of 03/02/2024.     ALLERGIES:   Etodolac, Prednisone, and Tramadol   SOCIAL HISTORY:  Social History   Socioeconomic History  . Marital status: Married  Occupational History  . Occupation: Semi-Retired  Tobacco Use  . Smoking status: Never    Passive exposure: Never  . Smokeless tobacco: Never  Vaping Use  . Vaping status: Never Used  Substance and Sexual Activity  . Alcohol use:  No  . Drug use: No  . Sexual activity: Yes  Social History Narrative   Feels safe in home.   Social Drivers of Corporate investment banker Strain: Low Risk  (02/22/2024)   Overall Financial Resource Strain (CARDIA)   . Difficulty of Paying Living Expenses: Not hard at all  Food Insecurity: No Food Insecurity (02/22/2024)   Hunger Vital Sign   . Worried About Programme researcher, broadcasting/film/video in the Last Year: Never true   . Ran Out of Food in the Last Year: Never true  Transportation Needs: No Transportation Needs (02/22/2024)   PRAPARE - Transportation   . Lack of Transportation (Medical): No   . Lack of Transportation (Non-Medical): No    FAMILY HISTORY:  Family  History  Problem Relation Name Age of Onset  . Pneumonia Mother    . High blood pressure (Hypertension) Mother    . Emphysema Father         COPD     GENERAL REVIEW OF SYSTEMS:   General ROS: negative for - chills, fatigue, fever, weight gain or weight loss Allergy and Immunology ROS: negative for - hives  Hematological and Lymphatic ROS: negative for - bleeding problems or bruising, negative for palpable nodes Endocrine ROS: negative for - heat or cold intolerance, hair changes Respiratory ROS: negative for - cough, shortness of breath or wheezing Cardiovascular ROS: no chest pain or palpitations GI ROS: negative for nausea, vomiting, abdominal pain, diarrhea, constipation Musculoskeletal ROS: negative for - joint swelling or muscle pain Neurological ROS: negative for - confusion, syncope Dermatological ROS: negative for pruritus and rash  PHYSICAL EXAM:  Vitals:   03/02/24 1301  BP: 105/63  Pulse: 79  .  Ht:157.5 cm (5' 2) Wt:59 kg (130 lb) ADJ:Anib surface area is 1.61 meters squared. Body mass index is 23.78 kg/m.[name]   GENERAL: Alert, active, oriented x3  HEENT: Pupils equal reactive to light. Extraocular movements are intact. Sclera clear. Palpebral conjunctiva normal red color.Pharynx clear.  NECK: Supple with no palpable mass and no adenopathy.  LUNGS: Sound clear with no rales rhonchi or wheezes.  HEART: Regular rhythm S1 and S2 without murmur.  ABDOMEN: Soft and depressible, nontender with no palpable mass, no hepatomegaly. Wounds dry and clean.  EXTREMITIES: Well-developed well-nourished symmetrical with no dependent edema.  NEUROLOGICAL: Awake alert oriented, facial expression symmetrical, moving all extremities.   Results RADIOLOGY Mammogram and Ultrasound: 7 mm hypoechoic mass in the right breast at 9:30 position, 8 cm from the nipple (02/09/2024) Bilateral Breast MRI: 1.4 cm enhancement anteriorly and inferiorly to the known malignancy in the outer right  breast; 0.4 cm focus of enhancement in the central right breast  PATHOLOGY Core Biopsy: Invasive lobular carcinoma in the outer right breast; intraductal papilloma with atypical ductal hyperplasia in the central focus of enhancement    Assessment & Plan Right breast invasive lobular carcinoma and atypical ductal hyperplasia (multifocal disease)   Multifocal disease in the right breast includes invasive lobular carcinoma and atypical ductal hyperplasia. Initial diagnosis via mammogram and ultrasound showed a 7 mm hypoechoic mass at the 9:30 position, 8 cm from the nipple. Bilateral breast MRI identified two additional areas: a 1.4 cm enhancement anteriorly and inferiorly to the known malignancy, and a 0.4 cm central enhancement. 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 Due to the multifocal nature and potential for cancer in the atypical ductal hyperplasia area, mastectomy  is recommended. Schedule mastectomy for Wednesday, March 09, 2024, with a sentinel lymph node biopsy during surgery.  Educate on the mastectomy procedure, including scar formation and use of drains. Provide information on post-operative care, including use of compression garments and drain management.    Malignant neoplasm of upper-outer quadrant of right breast in female, estrogen receptor positive (CMS/HHS-HCC) [C50.411, Z17.0]          Patient verbalized understanding, all questions were answered, and were agreeable with the plan outlined above.   Lucas Sjogren, MD  Electronically signed by Lucas Sjogren, MD

## 2024-03-03 ENCOUNTER — Other Ambulatory Visit: Payer: Self-pay | Admitting: General Surgery

## 2024-03-03 DIAGNOSIS — Z170 Estrogen receptor positive status [ER+]: Secondary | ICD-10-CM

## 2024-03-04 ENCOUNTER — Encounter: Payer: Self-pay | Admitting: *Deleted

## 2024-03-04 DIAGNOSIS — Z9189 Other specified personal risk factors, not elsewhere classified: Secondary | ICD-10-CM

## 2024-03-04 DIAGNOSIS — C50411 Malignant neoplasm of upper-outer quadrant of right female breast: Secondary | ICD-10-CM

## 2024-03-04 NOTE — Progress Notes (Signed)
 Mastectomy is scheduled for 10/15.  She will see Dr. KATHEE on 11/3 and [place]and on 11/5. Appt. Details given to her.

## 2024-03-08 ENCOUNTER — Encounter
Admission: RE | Admit: 2024-03-08 | Discharge: 2024-03-08 | Disposition: A | Discharge status: Home / Self Care | Source: Ambulatory Visit | Attending: General Surgery | Admitting: General Surgery

## 2024-03-08 ENCOUNTER — Encounter: Payer: Self-pay | Admitting: Urgent Care

## 2024-03-08 ENCOUNTER — Other Ambulatory Visit: Payer: Self-pay

## 2024-03-08 VITALS — Ht 63.0 in | Wt 125.0 lb

## 2024-03-08 DIAGNOSIS — Z01818 Encounter for other preprocedural examination: Secondary | ICD-10-CM | POA: Insufficient documentation

## 2024-03-08 DIAGNOSIS — E782 Mixed hyperlipidemia: Secondary | ICD-10-CM | POA: Insufficient documentation

## 2024-03-08 DIAGNOSIS — Z170 Estrogen receptor positive status [ER+]: Secondary | ICD-10-CM | POA: Insufficient documentation

## 2024-03-08 DIAGNOSIS — Z01810 Encounter for preprocedural cardiovascular examination: Secondary | ICD-10-CM | POA: Diagnosis not present

## 2024-03-08 DIAGNOSIS — C50411 Malignant neoplasm of upper-outer quadrant of right female breast: Secondary | ICD-10-CM | POA: Insufficient documentation

## 2024-03-08 NOTE — Patient Instructions (Addendum)
 Your procedure is scheduled on: Wednesday 03/09/24 Report to the Registration Desk on the 1st floor of the [facility] at 7:30 am as instructed.  [phone] Day Surgery Dept   REMEMBER: Instructions that are not followed completely may result in serious medical risk, up to and including death; or upon the discretion of your surgeon and anesthesiologist your surgery may need to be rescheduled.  Do not eat food or drink any liquids after midnight the night before surgery.  No gum chewing or hard candies.  One week prior to surgery: Stop Anti-inflammatories (NSAIDS) such as Advil, Aleve, Ibuprofen, Motrin, Naproxen, Naprosyn and Aspirin based products such as Excedrin, Goody's Powder, BC Powder.  You may however, continue to take Tylenol if needed for pain up until the day of surgery.  Stop ANY OVER THE COUNTER supplements and vitamins until after surgery.  Continue taking all of your other prescription medications up until the day of surgery.  ON THE DAY OF SURGERY ONLY TAKE THESE MEDICATIONS WITH SIPS OF WATER:  venlafaxine XR (EFFEXOR-XR) 37.5 MG 24 hr capsule   No Alcohol for 24 hours before or after surgery.  No Smoking including e-cigarettes for 24 hours before surgery.  No chewable tobacco products for at least 6 hours before surgery.  No nicotine patches on the day of surgery.  Do not use any recreational drugs for at least a week (preferably 2 weeks) before your surgery.  Please be advised that the combination of cocaine and anesthesia may have negative outcomes, up to and including death. If you test positive for cocaine, your surgery will be cancelled.  On the morning of surgery brush your teeth with toothpaste and water, you may rinse your mouth with mouthwash if you wish. Do not swallow any toothpaste or mouthwash.  Use CHG Soap or wipes as directed on instruction sheet.  Do not wear lotions, powders, deodorants or perfumes.  Do not shave body hair from the  neck down 48 hours before surgery.  Wear comfortable clothing (specific to your surgery type) to the hospital.  Do not wear jewelry, make-up, hairpins, clips or nail polish.  For welded (permanent) jewelry: bracelets, anklets, waist bands, etc.  Please have this removed prior to surgery.  If it is not removed, there is a chance that hospital personnel will need to cut it off on the day of surgery.  Contact lenses, hearing aids and dentures may not be worn into surgery. Bring a case if you wear glasses  Do not bring valuables to the hospital. [facility] is not responsible for any missing/lost belongings or valuables.   Notify your doctor if there is any change in your medical condition (cold, fever, infection).  After surgery, you can help prevent lung complications by doing breathing exercises.  Take deep breaths and cough every 1-2 hours. Your doctor may order a device called an Incentive Spirometer to help you take deep breaths.  When coughing or sneezing, hold a pillow firmly against your incision with your hands. This is called "splinting." Doing this helps protect your incision. It also decreases  discomfort.  If you are being admitted to the hospital overnight, leave your suitcase in the car. After surgery it may be brought to your room.  In case of increased patient census, it may be necessary for you, the patient, to continue your postoperative care in the Same Day Surgery department.  If you are being discharged the day of surgery, you will not be allowed to drive home. You  will need a responsible individual to drive you home and stay with you for 24 hours after surgery.   Please call the Pre-admissions Testing Dept. at [phone] if you have any questions about these instructions.  Surgery Visitation Policy:  Patients having surgery or a procedure may have two visitors.  Children under the age of 47 must have an adult with them who is not the patient.  Inpatient  Visitation:    Visiting hours are 7 a.m. to 8 p.m. Up to four visitors are allowed at one time in a patient room. The visitors may rotate out with other people during the day.  One visitor age 26 or older may stay with the patient overnight and must be in the room by 8 p.m.   Merchandiser, retail to address health-related social needs:  [email or web address]                                                                                                             Preparing for Surgery with CHLORHEXIDINE GLUCONATE (CHG) Soap  Chlorhexidine Gluconate (CHG) Soap  o An antiseptic cleaner that kills germs and bonds with the skin to continue killing germs even after washing  o Used for showering the night before surgery and morning of surgery  Before surgery, you can play an important role by reducing the number of germs on your skin.  CHG (Chlorhexidine gluconate) soap is an antiseptic cleanser which kills germs and bonds with the skin to continue killing germs even after washing.  Please do not use if you have an allergy to CHG or antibacterial soaps. If your skin becomes reddened/irritated stop using the CHG.  1. Shower the NIGHT BEFORE SURGERY with CHG soap.  2. If you choose to wash your hair, wash your hair first as usual with your normal shampoo.  3. After shampooing, rinse your hair and body thoroughly to remove the shampoo.  4. Use CHG as you would any other liquid soap. You can apply CHG directly to the skin and wash gently with a clean washcloth.  5. Apply the CHG soap to your body only from the neck down. Do not use on open wounds or open sores. Avoid contact with your eyes, ears, mouth, and genitals (private parts). Wash face and genitals (private parts) with your normal soap.  6. Wash thoroughly, paying special attention to the area where your surgery will be performed.  7. Thoroughly rinse your body with warm water.  8. Do not shower/wash with your  normal soap after using and rinsing off the CHG soap.  9. Do not use lotions, oils, etc., after showering with CHG.  10. Pat yourself dry with a clean towel.  11. Wear clean pajamas to bed the night before surgery.  12. Place clean sheets on your bed the night of your shower and do not sleep with pets.  13. Do not apply any deodorants/lotions/powders.  14. Please wear clean clothes to the hospital.  15. Remember to brush your teeth with your regular toothpaste.  How to  Use an Incentive Spirometer  An incentive spirometer is a tool that measures how well you are filling your lungs with each breath. Learning to take long, deep breaths using this tool can help you keep your lungs clear and active. This may help to reverse or lessen your chance of developing breathing (pulmonary) problems, especially infection. You may be asked to use a spirometer: After a surgery. If you have a lung problem or a history of smoking. After a long period of time when you have been unable to move or be active. If the spirometer includes an indicator to show the highest number that you have reached, your health care provider or respiratory therapist will help you set a goal. Keep a log of your progress as told by your health care provider. What are the risks? Breathing too quickly may cause dizziness or cause you to pass out. Take your time so you do not get dizzy or light-headed. If you are in pain, you may need to take pain medicine before doing incentive spirometry. It is harder to take a deep breath if you are having pain. How to use your incentive spirometer  Sit up on the edge of your bed or on a chair. Hold the incentive spirometer so that it is in an upright position. Before you use the spirometer, breathe out normally. Place the mouthpiece in your mouth. Make sure your lips are closed tightly around it. Breathe in slowly and as deeply as you can through your mouth, causing the piston or the ball to  rise toward the top of the chamber. Hold your breath for 3-5 seconds, or for as long as possible. If the spirometer includes a coach indicator, use this to guide you in breathing. Slow down your breathing if the indicator goes above the marked areas. Remove the mouthpiece from your mouth and breathe out normally. The piston or ball will return to the bottom of the chamber. Rest for a few seconds, then repeat the steps 10 or more times. Take your time and take a few normal breaths between deep breaths so that you do not get dizzy or light-headed. Do this every 1-2 hours when you are awake. If the spirometer includes a goal marker to show the highest number you have reached (best effort), use this as a goal to work toward during each repetition. After each set of 10 deep breaths, cough a few times. This will help to make sure that your lungs are clear. If you have an incision on your chest or abdomen from surgery, place a pillow or a rolled-up towel firmly against the incision when you cough. This can help to reduce pain while taking deep breaths and coughing. General tips When you are able to get out of bed: Walk around often. Continue to take deep breaths and cough in order to clear your lungs. Keep using the incentive spirometer until your health care provider says it is okay to stop using it. If you have been in the hospital, you may be told to keep using the spirometer at home. Contact a health care provider if: You are having difficulty using the spirometer. You have trouble using the spirometer as often as instructed. Your pain medicine is not giving enough relief for you to use the spirometer as told. You have a fever. Get help right away if: You develop shortness of breath. You develop a cough with bloody mucus from the lungs. You have fluid or blood coming from an incision site  after you cough. Summary An incentive spirometer is a tool that can help you learn to take long, deep  breaths to keep your lungs clear and active. You may be asked to use a spirometer after a surgery, if you have a lung problem or a history of smoking, or if you have been inactive for a long period of time. Use your incentive spirometer as instructed every 1-2 hours while you are awake. If you have an incision on your chest or abdomen, place a pillow or a rolled-up towel firmly against your incision when you cough. This will help to reduce pain. Get help right away if you have shortness of breath, you cough up bloody mucus, or blood comes from your incision when you cough. This information is not intended to replace advice given to you by your health care provider. Make sure you discuss any questions you have with your health care provider. Document Revised: 08/01/2019 Document Reviewed: 08/01/2019 Elsevier Patient Education  2023 [employer].

## 2024-03-09 ENCOUNTER — Encounter
Admission: RE | Disposition: A | Discharge status: Home / Self Care | Payer: Self-pay | Source: Home / Self Care | Attending: General Surgery

## 2024-03-09 ENCOUNTER — Observation Stay
Admission: RE | Admit: 2024-03-09 | Discharge: 2024-03-10 | Disposition: A | Discharge status: Home / Self Care | Attending: General Surgery | Admitting: General Surgery

## 2024-03-09 ENCOUNTER — Encounter: Payer: Self-pay | Admitting: General Surgery

## 2024-03-09 ENCOUNTER — Other Ambulatory Visit: Payer: Self-pay

## 2024-03-09 ENCOUNTER — Encounter: Payer: Self-pay | Admitting: Urgent Care

## 2024-03-09 ENCOUNTER — Encounter
Admission: RE | Admit: 2024-03-09 | Discharge: 2024-03-09 | Disposition: A | Discharge status: Home / Self Care | Source: Ambulatory Visit | Attending: General Surgery | Admitting: General Surgery

## 2024-03-09 ENCOUNTER — Ambulatory Visit: Admitting: Anesthesiology

## 2024-03-09 DIAGNOSIS — Z170 Estrogen receptor positive status [ER+]: Secondary | ICD-10-CM | POA: Insufficient documentation

## 2024-03-09 DIAGNOSIS — Z01818 Encounter for other preprocedural examination: Secondary | ICD-10-CM | POA: Diagnosis present

## 2024-03-09 DIAGNOSIS — C50911 Malignant neoplasm of unspecified site of right female breast: Principal | ICD-10-CM | POA: Insufficient documentation

## 2024-03-09 DIAGNOSIS — E782 Mixed hyperlipidemia: Secondary | ICD-10-CM | POA: Diagnosis not present

## 2024-03-09 DIAGNOSIS — C50411 Malignant neoplasm of upper-outer quadrant of right female breast: Secondary | ICD-10-CM | POA: Diagnosis not present

## 2024-03-09 DIAGNOSIS — N6011 Diffuse cystic mastopathy of right breast: Secondary | ICD-10-CM | POA: Insufficient documentation

## 2024-03-09 DIAGNOSIS — C50919 Malignant neoplasm of unspecified site of unspecified female breast: Principal | ICD-10-CM | POA: Diagnosis present

## 2024-03-09 HISTORY — PX: MASTECTOMY W/ SENTINEL NODE BIOPSY: SHX2001

## 2024-03-09 SURGERY — MASTECTOMY WITH SENTINEL LYMPH NODE BIOPSY
Anesthesia: General | Site: Breast | Laterality: Right

## 2024-03-09 MED ORDER — ONDANSETRON HCL 4 MG/2ML IJ SOLN
INTRAMUSCULAR | Status: DC | PRN
  Administered 2024-03-09: 4 mg via INTRAVENOUS

## 2024-03-09 MED ORDER — MORPHINE SULFATE (PF) 4 MG/ML IV SOLN: 4.0000 mg | INTRAVENOUS | Status: DC | PRN

## 2024-03-09 MED ORDER — HEPARIN SODIUM (PORCINE) 5000 UNIT/ML IJ SOLN
INTRAMUSCULAR | Status: AC
  Filled 2024-03-09: qty 1

## 2024-03-09 MED ORDER — ONDANSETRON 4 MG PO TBDP: 4.0000 mg | ORAL_TABLET | Freq: Four times a day (QID) | ORAL | Status: DC | PRN

## 2024-03-09 MED ORDER — ENOXAPARIN SODIUM 40 MG/0.4ML IJ SOSY
40.0000 mg | PREFILLED_SYRINGE | INTRAMUSCULAR | Status: DC
  Administered 2024-03-10: 40 mg via SUBCUTANEOUS
  Filled 2024-03-09: qty 0.4

## 2024-03-09 MED ORDER — LIDOCAINE HCL (PF) 2 % IJ SOLN
INTRAMUSCULAR | Status: AC
Start: 2024-03-09 — End: 2024-03-09
  Filled 2024-03-09: qty 5

## 2024-03-09 MED ORDER — OXYCODONE HCL 5 MG PO TABS
5.0000 mg | ORAL_TABLET | Freq: Once | ORAL | Status: AC | PRN
  Administered 2024-03-09: 5 mg via ORAL

## 2024-03-09 MED ORDER — FENTANYL CITRATE (PF) 100 MCG/2ML IJ SOLN
INTRAMUSCULAR | Status: AC
  Filled 2024-03-09: qty 2

## 2024-03-09 MED ORDER — OXYCODONE HCL 5 MG PO TABS
ORAL_TABLET | ORAL | Status: AC
  Filled 2024-03-09: qty 1

## 2024-03-09 MED ORDER — GABAPENTIN 300 MG PO CAPS
300.0000 mg | ORAL_CAPSULE | ORAL | Status: AC
Start: 2024-03-09 — End: 2024-03-09
  Administered 2024-03-09: 300 mg via ORAL

## 2024-03-09 MED ORDER — LACTATED RINGERS IV SOLN: INTRAVENOUS | Status: DC

## 2024-03-09 MED ORDER — CEFAZOLIN SODIUM-DEXTROSE 2-4 GM/100ML-% IV SOLN
INTRAVENOUS | Status: AC
  Filled 2024-03-09: qty 100

## 2024-03-09 MED ORDER — EPHEDRINE 5 MG/ML INJ
INTRAVENOUS | Status: AC
  Filled 2024-03-09: qty 5

## 2024-03-09 MED ORDER — CHLORHEXIDINE GLUCONATE 0.12 % MT SOLN
OROMUCOSAL | Status: AC
  Filled 2024-03-09: qty 15

## 2024-03-09 MED ORDER — APREMILAST 30 MG PO TABS: 30.0000 mg | ORAL_TABLET | Freq: Two times a day (BID) | ORAL | Status: DC

## 2024-03-09 MED ORDER — FENTANYL CITRATE (PF) 100 MCG/2ML IJ SOLN
25.0000 ug | INTRAMUSCULAR | Status: DC | PRN
  Administered 2024-03-09 (×5): 25 ug via INTRAVENOUS

## 2024-03-09 MED ORDER — ACETAMINOPHEN 500 MG PO TABS
ORAL_TABLET | ORAL | Status: AC
  Filled 2024-03-09: qty 2

## 2024-03-09 MED ORDER — TECHNETIUM TC 99M TILMANOCEPT KIT
1.0000 | PACK | Freq: Once | INTRAVENOUS | Status: AC | PRN
  Administered 2024-03-09: 1.02 via INTRADERMAL

## 2024-03-09 MED ORDER — IPRATROPIUM BROMIDE 0.03 % NA SOLN
2.0000 | Freq: Two times a day (BID) | NASAL | Status: DC
  Filled 2024-03-09: qty 30

## 2024-03-09 MED ORDER — LIDOCAINE HCL (PF) 2 % IJ SOLN
INTRAMUSCULAR | Status: AC
  Filled 2024-03-09: qty 5

## 2024-03-09 MED ORDER — BUPIVACAINE-EPINEPHRINE (PF) 0.25% -1:200000 IJ SOLN
INTRAMUSCULAR | Status: AC
  Filled 2024-03-09: qty 30

## 2024-03-09 MED ORDER — DROPERIDOL 2.5 MG/ML IJ SOLN: 0.6250 mg | Freq: Once | INTRAMUSCULAR | Status: DC | PRN

## 2024-03-09 MED ORDER — HEPARIN SODIUM (PORCINE) 5000 UNIT/ML IJ SOLN
5000.0000 [IU] | Freq: Once | INTRAMUSCULAR | Status: AC
  Administered 2024-03-09: 5000 [IU] via SUBCUTANEOUS

## 2024-03-09 MED ORDER — GABAPENTIN 300 MG PO CAPS
ORAL_CAPSULE | ORAL | Status: AC
  Filled 2024-03-09: qty 1

## 2024-03-09 MED ORDER — METHOCARBAMOL 500 MG PO TABS: 500.0000 mg | ORAL_TABLET | Freq: Four times a day (QID) | ORAL | Status: DC | PRN

## 2024-03-09 MED ORDER — HYDROCODONE-ACETAMINOPHEN 5-325 MG PO TABS
1.0000 | ORAL_TABLET | ORAL | Status: DC | PRN
  Administered 2024-03-10: 1 via ORAL
  Filled 2024-03-09: qty 1

## 2024-03-09 MED ORDER — EPHEDRINE SULFATE-NACL 50-0.9 MG/10ML-% IV SOSY
PREFILLED_SYRINGE | INTRAVENOUS | Status: DC | PRN
  Administered 2024-03-09 (×3): 5 mg via INTRAVENOUS

## 2024-03-09 MED ORDER — ONDANSETRON HCL 4 MG/2ML IJ SOLN
INTRAMUSCULAR | Status: AC
Start: 2024-03-09 — End: 2024-03-09
  Filled 2024-03-09: qty 2

## 2024-03-09 MED ORDER — ONDANSETRON HCL 4 MG/2ML IJ SOLN
4.0000 mg | Freq: Four times a day (QID) | INTRAMUSCULAR | Status: DC | PRN
  Administered 2024-03-09: 4 mg via INTRAVENOUS
  Filled 2024-03-09: qty 2

## 2024-03-09 MED ORDER — CHLORHEXIDINE GLUCONATE 0.12 % MT SOLN
15.0000 mL | Freq: Once | OROMUCOSAL | Status: AC
  Administered 2024-03-09: 15 mL via OROMUCOSAL

## 2024-03-09 MED ORDER — STERILE WATER FOR IRRIGATION IR SOLN
Status: DC | PRN
  Administered 2024-03-09: 400 mL

## 2024-03-09 MED ORDER — ORAL CARE MOUTH RINSE: 15.0000 mL | Freq: Once | OROMUCOSAL | Status: AC

## 2024-03-09 MED ORDER — VENLAFAXINE HCL ER 37.5 MG PO CP24
37.5000 mg | ORAL_CAPSULE | Freq: Every day | ORAL | Status: DC
Start: 2024-03-10 — End: 2024-03-10
  Administered 2024-03-10: 37.5 mg via ORAL
  Filled 2024-03-09: qty 1

## 2024-03-09 MED ORDER — LIDOCAINE HCL (CARDIAC) PF 100 MG/5ML IV SOSY
PREFILLED_SYRINGE | INTRAVENOUS | Status: DC | PRN
Start: 2024-03-09 — End: 2024-03-09
  Administered 2024-03-09: 80 mg via INTRAVENOUS

## 2024-03-09 MED ORDER — FENTANYL CITRATE (PF) 100 MCG/2ML IJ SOLN
INTRAMUSCULAR | Status: DC | PRN
  Administered 2024-03-09 (×4): 25 ug via INTRAVENOUS

## 2024-03-09 MED ORDER — CEFAZOLIN SODIUM-DEXTROSE 2-4 GM/100ML-% IV SOLN
2.0000 g | INTRAVENOUS | Status: AC
  Administered 2024-03-09: 2 g via INTRAVENOUS

## 2024-03-09 MED ORDER — BUPIVACAINE-EPINEPHRINE (PF) 0.25% -1:200000 IJ SOLN
INTRAMUSCULAR | Status: DC | PRN
  Administered 2024-03-09: 30 mL

## 2024-03-09 MED ORDER — PROPOFOL 10 MG/ML IV BOLUS
INTRAVENOUS | Status: AC
  Filled 2024-03-09: qty 20

## 2024-03-09 MED ORDER — ACETAMINOPHEN 500 MG PO TABS
1000.0000 mg | ORAL_TABLET | ORAL | Status: AC
  Administered 2024-03-09: 1000 mg via ORAL

## 2024-03-09 MED ORDER — PROPOFOL 10 MG/ML IV BOLUS
INTRAVENOUS | Status: DC | PRN
  Administered 2024-03-09: 20 mg via INTRAVENOUS
  Administered 2024-03-09: 150 mg via INTRAVENOUS

## 2024-03-09 MED ORDER — ACETAMINOPHEN 10 MG/ML IV SOLN: 1000.0000 mg | Freq: Once | INTRAVENOUS | Status: DC | PRN

## 2024-03-09 MED ORDER — OXYCODONE HCL 5 MG/5ML PO SOLN: 5.0000 mg | Freq: Once | ORAL | Status: AC | PRN

## 2024-03-09 MED ORDER — FUROSEMIDE 20 MG PO TABS
30.0000 mg | ORAL_TABLET | Freq: Every day | ORAL | Status: DC
  Administered 2024-03-09: 30 mg via ORAL
  Filled 2024-03-09: qty 2

## 2024-03-09 SURGICAL SUPPLY — 30 items
BINDER BREAST MEDIUM (GAUZE/BANDAGES/DRESSINGS) IMPLANT
BINDER BREAST XLRG (GAUZE/BANDAGES/DRESSINGS) IMPLANT
COVER PROBE GAMMA FINDER SLV (MISCELLANEOUS) ×1 IMPLANT
DERMABOND ADVANCED .7 DNX12 (GAUZE/BANDAGES/DRESSINGS) ×1 IMPLANT
DRAIN CHANNEL JP 15F RND 3/16 (MISCELLANEOUS) IMPLANT
DRAPE LAPAROTOMY TRNSV 106X77 (MISCELLANEOUS) ×1 IMPLANT
DRSG GAUZE FLUFF 36X18 (GAUZE/BANDAGES/DRESSINGS) ×1 IMPLANT
ELECTRODE REM PT RTRN 9FT ADLT (ELECTROSURGICAL) ×1 IMPLANT
EVACUATOR SILICONE 100CC (DRAIN) IMPLANT
GAUZE 4X4 16PLY ~~LOC~~+RFID DBL (SPONGE) IMPLANT
GAUZE SPONGE 4X4 12PLY STRL (GAUZE/BANDAGES/DRESSINGS) ×1 IMPLANT
GLOVE BIO SURGEON STRL SZ 6.5 (GLOVE) ×1 IMPLANT
GLOVE BIOGEL PI IND STRL 6.5 (GLOVE) ×1 IMPLANT
GLOVE SURG SYN 6.5 PF PI (GLOVE) ×2 IMPLANT
GOWN STRL REUS W/ TWL LRG LVL3 (GOWN DISPOSABLE) ×3 IMPLANT
KIT TURNOVER KIT A (KITS) ×1 IMPLANT
MANIFOLD NEPTUNE II (INSTRUMENTS) ×1 IMPLANT
NDL HYPO 22X1.5 SAFETY MO (MISCELLANEOUS) IMPLANT
NEEDLE HYPO 22X1.5 SAFETY MO (MISCELLANEOUS) ×1 IMPLANT
PACK BASIN MAJOR ARMC (MISCELLANEOUS) ×1 IMPLANT
PAD ABD DERMACEA PRESS 5X9 (GAUZE/BANDAGES/DRESSINGS) ×1 IMPLANT
SPONGE T-LAP 18X18 ~~LOC~~+RFID (SPONGE) ×2 IMPLANT
SUT SILK 3 0 SH CR/8 (SUTURE) ×1 IMPLANT
SUT VIC AB 3-0 54X BRD REEL (SUTURE) ×1 IMPLANT
SUT VIC AB 3-0 SH 27X BRD (SUTURE) ×2 IMPLANT
SUT VIC AB 3-0 SH 8-18 (SUTURE) ×1 IMPLANT
SUTURE EHLN 3-0 FS-10 30 BLK (SUTURE) IMPLANT
SUTURE MNCRL 4-0 27XMF (SUTURE) ×2 IMPLANT
TRAP FLUID SMOKE EVACUATOR (MISCELLANEOUS) ×1 IMPLANT
WATER STERILE IRR 500ML POUR (IV SOLUTION) ×1 IMPLANT

## 2024-03-09 NOTE — Anesthesia Preprocedure Evaluation (Signed)
 Anesthesia Evaluation  Patient identified by MRN, date of birth, ID band Patient awake    Reviewed: Allergy & Precautions, H&P , NPO status , Patient's Chart, lab work & pertinent test results, reviewed documented beta blocker date and time   Airway Mallampati: II  TM Distance: >3 FB Neck ROM: full    Dental  (+) Teeth Intact   Pulmonary neg pulmonary ROS   Pulmonary exam normal        Cardiovascular Exercise Tolerance: Good negative cardio ROS Normal cardiovascular exam Rate:Normal     Neuro/Psych  Neuromuscular disease  negative psych ROS   GI/Hepatic negative GI ROS, Neg liver ROS,,,  Endo/Other  negative endocrine ROS    Renal/GU negative Renal ROS  negative genitourinary   Musculoskeletal   Abdominal   Peds  Hematology negative hematology ROS (+)   Anesthesia Other Findings   Reproductive/Obstetrics negative OB ROS                              Anesthesia Physical Anesthesia Plan  ASA: 2  Anesthesia Plan: General LMA   Post-op Pain Management:    Induction:   PONV Risk Score and Plan:   Airway Management Planned:   Additional Equipment:   Intra-op Plan:   Post-operative Plan:   Informed Consent: I have reviewed the patients History and Physical, chart, labs and discussed the procedure including the risks, benefits and alternatives for the proposed anesthesia with the patient or authorized representative who has indicated his/her understanding and acceptance.       Plan Discussed with: CRNA  Anesthesia Plan Comments:         Anesthesia Quick Evaluation

## 2024-03-09 NOTE — Transfer of Care (Signed)
 Immediate Anesthesia Transfer of Care Note  Patient: Jodi Williamson  Procedure(s) Performed: MASTECTOMY WITH SENTINEL LYMPH NODE BIOPSY (Right: Breast)  Patient Location: PACU  Anesthesia Type:General  Level of Consciousness: drowsy  Airway & Oxygen Therapy: Patient Spontanous Breathing and Patient connected to face mask oxygen  Post-op Assessment: Report given to RN and Post -op Vital signs reviewed and stable  Post vital signs: stable  Last Vitals:  Vitals Value Taken Time  BP 107/34 03/09/24 11:03  Temp    Pulse 78 03/09/24 11:09  Resp 15 03/09/24 11:09  SpO2 100 % 03/09/24 11:09  Vitals shown include unfiled device data.  Last Pain:  Vitals:   03/09/24 0848  TempSrc: Temporal  PainSc: 0-No pain         Complications: No notable events documented.

## 2024-03-09 NOTE — Op Note (Signed)
 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  Postoperative diagnosis: Same.   Procedure: Right Breast total mastectomy.                     Sentinel Lymph node biopsy  Anesthesia: GETA  Surgeon: Dr. Cesar Coe  Wound Classification: Clean  Indications: Patient is a 73 y.o. 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 After discussion of alternatives, the patient elected total (simple) mastectomy.  Findings: Multiple hematomas of the right breast but no obvious palpable masses 2.   Adequate hemostasis  Description of procedure: The patient was brought to the operating room and general anesthesia was induced. A time-out was completed verifying correct patient, procedure, site, positioning, and implant(s) and/or special equipment prior to beginning this procedure. The breast, chest wall, axilla, and upper arm and neck were prepped and draped in the usual sterile fashion.  A skin incision was made that encompassed the nipple-areola complex and the previous biopsy scar and passed in an oblique direction across the breast. Flaps were raised in the avascular plane between subcutaneous tissue and breast tissue from the clavicle superiorly, the sternum medially, the anterior rectus sheath inferiorly, and past the lateral border of the pectoralis major muscle laterally. Hemostasis was achieved in the flaps. Next, the breast tissue and underlying pectoralis fascia were excised from the pectoralis major muscle, progressing from medially to laterally. At the lateral border of the pectoralis major muscle, the breast tissue was swung laterally and a lateral pedicle identified where breast tissue gave way to fat of axilla. The lateral pedicle was incised and the specimen removed.  A hand-held gamma probe was used to identify the location of the hottest spot in the axilla. Dissection was carried  down until subdermal facias was advanced. The probe was placed and again, the point of maximal count was found. Dissection continue until nodule was identified. The probe was placed in contact with the node. The node was excised in its entirety.  Three additional hot spots were detected and the nodes were excised in similar fashion. No additional hot spots were identified. No clinically abnormal nodes were palpated. The procedure was terminated. Hemostasis was achieved and the wound closed in layers with deep interrupted 3-0 Vicryl and skin was closed with subcuticular suture of Monocryl 3-0.  The wound was irrigated and hemostasis was achieved. Closed suction drain was brought into the operative field through a separate stab incision and sutured to the skin with a 3-0 nylon suture. The wound was closed with interrupted 3-0 Vicryl to the subcutaneous layer, followed by a subcuticular layer of Monocryl 4-0. The wound was dressed.  The patient tolerated the procedure well and was taken to the postanesthesia care unit in stable condition.     Sentinel Node Biopsy Synoptic Operative Report  Operation performed with curative intent:Yes  Tracer(s) used to identify sentinel nodes in the upfront surgery (non-neoadjuvant) setting (select all that apply):Radioactive Tracer  Tracer(s) used to identify sentinel nodes in the neoadjuvant setting (select all that apply):N/A  All nodes (colored or non-colored) present at the end of a dye-filled lymphatic channel were removed:N/A  All significantly radioactive nodes were removed:Yes  All palpable suspicious nodes were removed:N/A  Biopsy-proven positive nodes marked with clips prior to chemotherapy were identified and removed:N/A  Specimen:  Right Breast  Sentinel lymph nodes. #1, #2, #3, #4  Complications: None  Estimated Blood Loss: 50 mL

## 2024-03-09 NOTE — Interval H&P Note (Signed)
 History and Physical Interval Note:  03/09/2024 8:38 AM  Jodi Williamson  has presented today for surgery, with the diagnosis of C50.411 Z17.0 Malignant neoplasm of upper outer quadrant of rt breast in female estrogen receptor positive.  The various methods of treatment have been discussed with the patient and family. After consideration of risks, benefits and other options for treatment, the patient has consented to  Procedure(s): MASTECTOMY WITH SENTINEL LYMPH NODE BIOPSY (Right) as a surgical intervention.  The patient's history has been reviewed, patient examined, no change in status, stable for surgery.  I have reviewed the patient's chart and labs.  Questions were answered to the patient's satisfaction.     Lucas Sjogren

## 2024-03-10 ENCOUNTER — Other Ambulatory Visit: Payer: Self-pay

## 2024-03-10 ENCOUNTER — Encounter: Payer: Self-pay | Admitting: General Surgery

## 2024-03-10 DIAGNOSIS — C50911 Malignant neoplasm of unspecified site of right female breast: Secondary | ICD-10-CM | POA: Diagnosis not present

## 2024-03-10 LAB — CBC
HCT: 31.8 % — ABNORMAL LOW (ref 36.0–46.0)
Hemoglobin: 10.5 g/dL — ABNORMAL LOW (ref 12.0–15.0)
MCH: 29.7 pg (ref 26.0–34.0)
MCHC: 33 g/dL (ref 30.0–36.0)
MCV: 89.8 fL (ref 80.0–100.0)
Platelets: 176 K/uL (ref 150–400)
RBC: 3.54 MIL/uL — ABNORMAL LOW (ref 3.87–5.11)
RDW: 12.3 % (ref 11.5–15.5)
WBC: 9.4 K/uL (ref 4.0–10.5)
nRBC: 0 % (ref 0.0–0.2)

## 2024-03-10 LAB — BASIC METABOLIC PANEL WITH GFR
Anion gap: 13 (ref 5–15)
BUN: 18 mg/dL (ref 8–23)
CO2: 25 mmol/L (ref 22–32)
Calcium: 8.6 mg/dL — ABNORMAL LOW (ref 8.9–10.3)
Chloride: 102 mmol/L (ref 98–111)
Creatinine, Ser: 1 mg/dL (ref 0.44–1.00)
GFR, Estimated: 60 mL/min — ABNORMAL LOW (ref >60)
Glucose, Bld: 110 mg/dL — ABNORMAL HIGH (ref 70–99)
Potassium: 3.9 mmol/L (ref 3.5–5.1)
Sodium: 140 mmol/L (ref 135–145)

## 2024-03-10 MED ORDER — CYCLOBENZAPRINE HCL 5 MG PO TABS
5.0000 mg | ORAL_TABLET | Freq: Three times a day (TID) | ORAL | 0 refills | Status: AC | PRN
  Filled 2024-03-10: qty 30, 10d supply, fill #0

## 2024-03-10 MED ORDER — HYDROCODONE-ACETAMINOPHEN 5-325 MG PO TABS
1.0000 | ORAL_TABLET | ORAL | 0 refills | Status: AC | PRN
  Filled 2024-03-10: qty 30, 5d supply, fill #0

## 2024-03-10 NOTE — Discharge Instructions (Signed)
  Diet: Resume home heart healthy regular diet.   Activity: No heavy lifting >20 pounds (children, pets, laundry, garbage) or strenuous activity until follow-up, but light activity and walking are encouraged. Do not drive or drink alcohol if taking narcotic pain medications.  Wound care: May shower with soapy water and pat dry (do not rub incisions), but no baths or submerging incision underwater until follow-up. (no swimming)   Empty and chart drain 1 or 2 times per day as instructed.   Medications: Resume all home medications. For mild to moderate pain: acetaminophen (Tylenol) or ibuprofen (if no kidney disease). Combining Tylenol with alcohol can substantially increase your risk of causing liver disease. Narcotic pain medications, if prescribed, can be used for severe pain, though may cause nausea, constipation, and drowsiness. Do not combine Tylenol and Norco within a 6 hour period as Norco contains Tylenol. If you do not need the narcotic pain medication, you do not need to fill the prescription.  Call office [phone]) at any time if any questions, worsening pain, fevers/chills, bleeding, drainage from incision site, or other concerns.

## 2024-03-10 NOTE — Care Management Obs Status (Signed)
 MEDICARE OBSERVATION STATUS NOTIFICATION   Patient Details  Name: Jodi Williamson MRN: [id number] Date of Birth: [birth year]   Medicare Observation Status Notification Given:  Yes    Rojelio [name] 03/10/2024, 11:17 AM

## 2024-03-10 NOTE — Discharge Summary (Signed)
 Patient ID: Jodi Williamson MRN: [id number] DOB/AGE: 01/29/1951 73 y.o.  Admit date: 03/09/2024 Discharge date: 03/10/2024   Discharge Diagnoses:  Principal Problem:   Breast cancer [facility])   Procedures: Right total mastectomy with right axillary sentinel lymph node biopsy  Hospital Course: Patient admitted with multicentric right breast cancer.  She underwent right total mastectomy with right axillary sentinel lymph node biopsy.  She has been recovering adequately.  No sign of complication in the immediate postop course.  Pain is controlled.  Drain output is serosanguineous with adequate output amount.  Physical Exam Vitals reviewed.  HENT:     Head: Normocephalic.  Cardiovascular:     Rate and Rhythm: Normal rate and regular rhythm.  Pulmonary:     Effort: Pulmonary effort is normal.     Breath sounds: Normal breath sounds.  Chest:  Breasts:    Right: Absent.     Left: Normal.  Abdominal:     General: Abdomen is flat.  Musculoskeletal:        General: Normal range of motion.     Cervical back: Normal range of motion.  Skin:    General: Skin is warm.     Capillary Refill: Capillary refill takes less than 2 seconds.  Neurological:     Mental Status: She is alert and oriented to person, place, and time.    Consults: None  Disposition: Discharge disposition: 01-Home or Self Care       Discharge Instructions     Diet - low sodium heart healthy   Complete by: As directed    Increase activity slowly   Complete by: As directed       Allergies as of 03/10/2024       Reactions   Etodolac Itching   Prednisone Other (See Comments)   Other reaction(s): Dizziness States today 03/08/24 she took some recently and had no problems   Tramadol Nausea Only   Penicillins Rash        Medication List     TAKE these medications    Cholecalciferol 50 MCG (2000 UT) Caps Take 2,000 Units by mouth daily.   Cyanocobalamin 5000 MCG Subl Place 5,000 mcg under the tongue  once a week.   cyclobenzaprine 5 MG tablet Commonly known as: FLEXERIL Take 1 tablet (5 mg total) by mouth 3 (three) times daily as needed for muscle spasms.   estradiol 1 MG tablet Commonly known as: ESTRACE Take 0.5 mg by mouth daily.   furosemide 20 MG tablet Commonly known as: LASIX Take 30 mg by mouth daily.   HYDROcodone-acetaminophen 5-325 MG tablet Commonly known as: NORCO/VICODIN Take 1-2 tablets by mouth every 4 (four) hours as needed for moderate pain (pain score 4-6).   ipratropium 0.03 % nasal spray Commonly known as: ATROVENT Place 2 sprays into both nostrils 2 (two) times daily.   Otezla 30 MG Tabs Generic drug: Apremilast Take 30 mg by mouth 2 (two) times daily.   triamcinolone cream 0.1 % Commonly known as: KENALOG Apply 1 Application topically daily as needed (irritation).   venlafaxine XR 37.5 MG 24 hr capsule Commonly known as: EFFEXOR-XR Take 37.5 mg by mouth daily with breakfast.        Follow-up Information     Rodolph Romano, MD Follow up in 1 week(s).   Specialty: General Surgery Why: For drain and wound check Contact information: [address] [place] [address] [phone]

## 2024-03-10 NOTE — Plan of Care (Signed)
 IV removed, patient educated on how to manage JP drain, empty, and change dressing, patient to be discharged to home with husband

## 2024-03-12 NOTE — Anesthesia Postprocedure Evaluation (Signed)
 Anesthesia Post Note  Patient: VERNETA HAMIDI  Procedure(s) Performed: MASTECTOMY WITH SENTINEL LYMPH NODE BIOPSY (Right: Breast)  Patient location during evaluation: PACU Anesthesia Type: General Level of consciousness: awake and alert Pain management: pain level controlled Vital Signs Assessment: post-procedure vital signs reviewed and stable Respiratory status: spontaneous breathing, nonlabored ventilation, respiratory function stable and patient connected to nasal cannula oxygen Cardiovascular status: blood pressure returned to baseline and stable Postop Assessment: no apparent nausea or vomiting Anesthetic complications: no   No notable events documented.   Last Vitals:  Vitals:   03/10/24 0819 03/10/24 0955  BP: (!) 98/52 (!) 106/45  Pulse: 69   Resp: 18   Temp: 36.6 C   SpO2: 99%     Last Pain:  Vitals:   03/10/24 1100  TempSrc:   PainSc: 1                  Prentice Murphy

## 2024-03-14 ENCOUNTER — Other Ambulatory Visit: Payer: Self-pay

## 2024-03-15 LAB — SURGICAL PATHOLOGY

## 2024-03-16 ENCOUNTER — Encounter: Payer: Self-pay | Admitting: *Deleted

## 2024-03-16 NOTE — Progress Notes (Signed)
 Oncotype Dx order [id number] submitted online

## 2024-03-28 ENCOUNTER — Encounter: Payer: Self-pay | Admitting: Internal Medicine

## 2024-03-28 ENCOUNTER — Telehealth: Payer: Self-pay | Admitting: Internal Medicine

## 2024-03-28 ENCOUNTER — Inpatient Hospital Stay: Admitting: Internal Medicine

## 2024-03-28 ENCOUNTER — Inpatient Hospital Stay: Attending: Internal Medicine | Admitting: Internal Medicine

## 2024-03-28 VITALS — BP 112/50 | HR 74 | Temp 96.7°F | Resp 18 | Ht 63.0 in | Wt 130.9 lb

## 2024-03-28 DIAGNOSIS — Z9011 Acquired absence of right breast and nipple: Secondary | ICD-10-CM | POA: Insufficient documentation

## 2024-03-28 DIAGNOSIS — Z170 Estrogen receptor positive status [ER+]: Secondary | ICD-10-CM | POA: Diagnosis not present

## 2024-03-28 DIAGNOSIS — C50411 Malignant neoplasm of upper-outer quadrant of right female breast: Secondary | ICD-10-CM | POA: Diagnosis not present

## 2024-03-28 DIAGNOSIS — Z1732 Human epidermal growth factor receptor 2 negative status: Secondary | ICD-10-CM | POA: Insufficient documentation

## 2024-03-28 DIAGNOSIS — Z90710 Acquired absence of both cervix and uterus: Secondary | ICD-10-CM | POA: Insufficient documentation

## 2024-03-28 DIAGNOSIS — Z1721 Progesterone receptor positive status: Secondary | ICD-10-CM | POA: Insufficient documentation

## 2024-03-28 MED ORDER — ANASTROZOLE 1 MG PO TABS: 1.0000 mg | ORAL_TABLET | Freq: Every day | ORAL | 4 refills | Status: AC

## 2024-03-28 NOTE — Telephone Encounter (Signed)
 Spoke to patient regarding results of the Oncotype-low risk.  As discussed earlier no role for any adjuvant chemotherapy.  Continue antihormone pills as ordered.  90-day prescription filled by pharmacy.

## 2024-03-28 NOTE — Progress Notes (Signed)
 03/10/24 mastectomy, Dr. Rodolph.

## 2024-03-28 NOTE — Progress Notes (Signed)
 [facility] CONSULT NOTE  Patient Care Team: Cleotilde Oneil FALCON, MD as PCP - General (Internal Medicine) Georgina Shasta POUR, RN as Oncology Nurse Navigator Rennie Cindy SAUNDERS, MD as Consulting Physician (Oncology)  CHIEF COMPLAINTS/PURPOSE OF CONSULTATION: Breast cancer  #  Oncology History Overview Note  IMPRESSION: 1. Subtle, hypoechoic mass in the RIGHT breast at the 9:30 position 8 cm from the nipple measuring 7 mm, correlating with the palpable area of concern indicated by the patient and likely correlating with architectural distortion in this region on same-day mammogram. Further characterization with ultrasound-guided biopsy is recommended.   2.  No RIGHT axillary lymphadenopathy seen.   3.  No mammographic evidence of malignancy in the LEFT breast.  INVASIVE LOBULAR CARCINOMA, 2.0 CM, GRADE 1      LOBULAR CARCINOMA IN SITU: IDENTIFIED      MARGINS, INVASIVE:  NEGATIVE      CLOSEST, INVASIVE:  0.8 CM FROM POSTERIOR      MARGINS, LCIS:  NEGATIVE      CLOSEST, LCIS:  >1 CM FROM POSTERIOR      LYMPHOVASCULAR INVASION:  NOT IDENTIFIED      PROGNOSTIC MARKERS:  ER POSITIVE, PR POSITIVE, HER2 NEGATIVE, KI-67 1%      OTHER: FIBROCYSTIC CHANGES, MICROSCOPIC INTRADUCTAL PAPILLOMA      SEE ONCOLOGY TABLE       2. Lymph node, sentinel, biopsy, Right axillary 1,2,3 and 4 :      FOUR LYMPH NODES, NEGATIVE FOR METASTATIC CARCINOMA (0/4)   #  oncotype  RA- 13; LOW risk of recurrence. NO role for chemo therapy.  NO RADIATION.  # NOV 3rd, 2025- start Anastrazole x5 years,      Malignant neoplasm of upper-outer quadrant of right breast in female, estrogen receptor positive (HCC)  02/10/2024 Initial Diagnosis   Malignant neoplasm of upper-outer quadrant of right breast in female, estrogen receptor positive (HCC)   02/10/2024 Cancer Staging   Staging form: Breast, AJCC 8th Edition - Clinical: Stage IA (cT1b, cN0, cM0, G1, ER+, PR+, HER2-) - Signed by Tamecca Artiga R, MD  on 03/28/2024 Histologic grading system: 3 grade system      HISTORY OF PRESENTING ILLNESS: Patient ambulating-independently.Alone.   Jodi Williamson Lat 73 y.o.  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   Patient does not have any other signs or symptoms.  No bone pain or joint pains.   Review of Systems  Constitutional:  Negative for chills, diaphoresis, fever, malaise/fatigue and weight loss.  HENT:  Negative for nosebleeds and sore throat.   Eyes:  Negative for double vision.  Respiratory:  Negative for cough, hemoptysis, sputum production, shortness of breath and wheezing.   Cardiovascular:  Negative for chest pain, palpitations, orthopnea and leg swelling.  Gastrointestinal:  Negative for abdominal pain, blood in stool, constipation, diarrhea, heartburn, melena, nausea and vomiting.  Genitourinary:  Negative for dysuria, frequency and urgency.  Musculoskeletal:  Negative for back pain and joint pain.  Skin: Negative.  Negative for itching and rash.  Neurological:  Negative for dizziness, tingling, focal weakness, weakness and headaches.  Endo/Heme/Allergies:  Does not bruise/bleed easily.  Psychiatric/Behavioral:  Negative for depression. The patient is not nervous/anxious and does not have insomnia.      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   Psoriatic arthritis (HCC)  SURGICAL HISTORY: Past Surgical History:  Procedure Laterality Date   ABDOMINAL HYSTERECTOMY     BREAST BIOPSY Right 02/04/2024   US  RT BREAST BX W LOC DEV 1ST LESION IMG BX SPEC US  GUIDE 02/04/2024 [facility]   BREAST EXCISIONAL BIOPSY Bilateral 1983   neg   CATARACT EXTRACTION W/PHACO Left 03/03/2023   Procedure: CATARACT EXTRACTION PHACO AND INTRAOCULAR LENS PLACEMENT (IOC) LEFT 5.58 00:39.1;  Surgeon: Jaye Fallow,  MD;  Location: [facility];  Service: Ophthalmology;  Laterality: Left;   CATARACT EXTRACTION W/PHACO Right 03/17/2023   Procedure: CATARACT EXTRACTION PHACO AND INTRAOCULAR LENS PLACEMENT (IOC) RIGHT;  Surgeon: Jaye Fallow, MD;  Location: [facility];  Service: Ophthalmology;  Laterality: Right;  5.23 0:37.8   MASTECTOMY W/ SENTINEL NODE BIOPSY Right 03/09/2024   Procedure: MASTECTOMY WITH SENTINEL LYMPH NODE BIOPSY;  Surgeon: Rodolph Romano, MD;  Location: [facility];  Service: General;  Laterality: Right;   TONSILLECTOMY      SOCIAL HISTORY: Social History   Socioeconomic History   Marital status: Married    Spouse name: Not on file   Number of children: Not on file   Years of education: Not on file   Highest education level: Not on file  Occupational History   Not on file  Tobacco Use   Smoking status: Never   Smokeless tobacco: Never  Vaping Use   Vaping status: Never Used  Substance and Sexual Activity   Alcohol use: Never   Drug use: Never   Sexual activity: Not on file  Other Topics Concern   Not on file  Social History Narrative   Not on file   Social Drivers of Health   Financial Resource Strain: Low Risk  (02/22/2024)   Received from [facility]   Overall Financial Resource Strain (CARDIA)    Difficulty of Paying Living Expenses: Not hard at all  Food Insecurity: No Food Insecurity (03/09/2024)   Hunger Vital Sign    Worried About Running Out of Food in the Last Year: Never true    Ran Out of Food in the Last Year: Never true  Transportation Needs: No Transportation Needs (03/09/2024)   PRAPARE - Administrator, Civil Service (Medical): No    Lack of Transportation (Non-Medical): No  Physical Activity: Not on file  Stress: Not on file  Social Connections: Moderately Isolated (03/09/2024)   Social Connection and Isolation Panel    Frequency of Communication with Friends and Family: Never    Frequency  of Social Gatherings with Friends and Family: Never    Attends Religious Services: Never    Database Administrator or Organizations: Yes    Attends Banker Meetings: Never    Marital Status: Married  Catering Manager Violence: Not At Risk (03/09/2024)   Humiliation, Afraid, Rape, and Kick questionnaire    Fear of Current or Ex-Partner: No    Emotionally Abused: No    Physically Abused: No    Sexually Abused: No    FAMILY HISTORY: Family History  Problem Relation Age of Onset   Breast cancer Neg Hx     ALLERGIES:  is allergic to etodolac, prednisone, tramadol, and penicillins.  MEDICATIONS:  Current Outpatient Medications  Medication Sig Dispense Refill   anastrozole (ARIMIDEX) 1 MG tablet Take 1 tablet (1 mg total) by mouth daily. 30 tablet 4   Apremilast (OTEZLA) 30 MG TABS Take 30 mg by mouth 2 (two) times daily.     Cholecalciferol 50 MCG (  2000 UT) CAPS Take 2,000 Units by mouth daily.     Cyanocobalamin 5000 MCG SUBL Place 5,000 mcg under the tongue once a week.     cyclobenzaprine (FLEXERIL) 5 MG tablet Take 1 tablet (5 mg total) by mouth 3 (three) times daily as needed for muscle spasms. 30 tablet 0   HYDROcodone-acetaminophen (NORCO/VICODIN) 5-325 MG tablet Take 1-2 tablets by mouth every 4 (four) hours as needed for moderate pain (pain score 4-6). 30 tablet 0   ipratropium (ATROVENT) 0.03 % nasal spray Place 2 sprays into both nostrils 2 (two) times daily.     triamcinolone cream (KENALOG) 0.1 % Apply 1 Application topically daily as needed (irritation).     venlafaxine XR (EFFEXOR-XR) 37.5 MG 24 hr capsule Take 37.5 mg by mouth daily with breakfast.     No current facility-administered medications for this visit.      PHYSICAL EXAMINATION:   Vitals:   03/28/24 1011  BP: (!) 112/50  Pulse: 74  Resp: 18  Temp: (!) 96.7 F (35.9 C)  SpO2: 100%   Filed Weights   03/28/24 1011  Weight: 130 lb 14.4 oz (59.4 kg)    Physical Exam Vitals and  nursing note reviewed.  HENT:     Head: Normocephalic and atraumatic.     Mouth/Throat:     Pharynx: Oropharynx is clear.  Eyes:     Extraocular Movements: Extraocular movements intact.     Pupils: Pupils are equal, round, and reactive to light.  Cardiovascular:     Rate and Rhythm: Normal rate and regular rhythm.  Pulmonary:     Comments: Decreased breath sounds bilaterally.  Abdominal:     Palpations: Abdomen is soft.  Musculoskeletal:        General: Normal range of motion.     Cervical back: Normal range of motion.  Skin:    General: Skin is warm.  Neurological:     General: No focal deficit present.     Mental Status: She is alert and oriented to person, place, and time.  Psychiatric:        Behavior: Behavior normal.        Judgment: Judgment normal.      LABORATORY DATA:  I have reviewed the data as listed Lab Results  Component Value Date   WBC 9.4 03/10/2024   HGB 10.5 (L) 03/10/2024   HCT 31.8 (L) 03/10/2024   MCV 89.8 03/10/2024   PLT 176 03/10/2024   Recent Labs    03/10/24 0531  NA 140  K 3.9  CL 102  CO2 25  GLUCOSE 110*  BUN 18  CREATININE 1.00  CALCIUM 8.6*  GFRNONAA 60*    RADIOGRAPHIC STUDIES: I have personally reviewed the radiological images as listed and agreed with the findings in the report. NM Sentinel Node Inj-No Rpt (Breast) Result Date: 03/09/2024 Lymphoseek was injected by the Nuclear Medicine Technologist for sentinel lymph node localization.   MR RT BREAST BX W LOC DEV 1ST LESION IMAGE BX SPEC MR GUIDE Addendum Date: 03/01/2024 ADDENDUM REPORT: 03/01/2024 14:49 ADDENDUM: PATHOLOGY revealed: Site 1. Breast, right, needle core biopsy, outer- INVASIVE LOBULAR CARCINOMA, OVERALL GRADE: 1. LYMPHOVASCULAR INVASION: NOT IDENTIFIED. CANCER LENGTH: 9 MM. CALCIFICATIONS: PRESENT. OTHER FINDINGS: USUAL DUCTAL HYPERPLASIA. Pathology results are CONCORDANT with imaging findings, per Dr. Dina Arceo. PATHOLOGY revealed: Site 2. 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 FIBROCYSTIC CHANGES WITH USUAL DUCTAL HYPERPLASIA. Pathology results are CONCORDANT with imaging findings, per Dr.  Dina Arceo. Pathology results and recommendations below were discussed with patient by telephone on 03/01/2024 by Rock Hover RN. Patient reported biopsy site within normal limits with slight tenderness at the site. Post biopsy care instructions were reviewed, questions were answered and my direct phone number was provided to patient. Patient was instructed to call [facility] if any concerns or questions arise related to the biopsy. RECOMMENDATION: The patient has a recent diagnosis of Right breast cancer and should follow her outlined treatment plan. Biopsy results sent via Epic message to Dr. Cesar Coe by Rock Hover, RN. Pathology results reported by Rock Hover RN 03/01/2024. Electronically Signed   By: Dina  Arceo M.D.   On: 03/01/2024 14:49   Result Date: 03/01/2024 CLINICAL DATA:  73 year old female with recently diagnosed invasive lobular carcinoma in the 930 region the right breast (ribbon shaped clip). Two indeterminate areas of enhancement seen with MR imaging. MR guided biopsy is recommended. EXAM: MRI GUIDED CORE NEEDLE BIOPSY OF THE RIGHT BREAST x2 TECHNIQUE: Multiplanar, multisequence MR imaging of the right breast was performed both before and after administration of intravenous contrast. CONTRAST:  6 ml Vueway COMPARISON:  Previous exam(s). FINDINGS: I met with the patient, and we discussed the procedure of MRI guided biopsy, including risks, benefits, and alternatives. Specifically, we discussed the risks of infection, bleeding, tissue injury, clip migration, and inadequate sampling. Informed, written consent was given. The usual time out protocol was performed immediately prior to the procedure. Using sterile technique, 1% lidocaine  and 1% lidocaine  with epinephrine , MRI  guidance, and a 9 gauge vacuum assisted device, biopsy was performed of non masslike enhancement in the lateral aspect of the right breast using a lateral to medial approach. At the conclusion of the procedure, a barbell shaped tissue marker clip was deployed into the biopsy cavity. Follow-up 2-view mammogram was performed and dictated separately. Using sterile technique, 1% lidocaine  and 1% lidocaine  with epinephrine , MRI guidance, and a 9 gauge vacuum assisted device, biopsy was performed of small area of focal enhancement in the central region the right breast using a lateral to medial approach. At the conclusion of the procedure, a cylindrical shaped tissue marker clip was deployed into the biopsy cavity. Follow-up 2-view mammogram was performed and dictated separately. IMPRESSION: MRI guided biopsy of the right breast x2. No apparent complications. Electronically Signed: By: Dina  Arceo M.D. On: 02/29/2024 11:11   MR RT BREAST BX W LOC DEV EA ADD LESION IMAGE BX SPEC MR GUIDE Addendum Date: 03/01/2024 ADDENDUM REPORT: 03/01/2024 14:49 ADDENDUM: PATHOLOGY revealed: Site 1. Breast, right, needle core biopsy, outer- INVASIVE LOBULAR CARCINOMA, OVERALL GRADE: 1. LYMPHOVASCULAR INVASION: NOT IDENTIFIED. CANCER LENGTH: 9 MM. CALCIFICATIONS: PRESENT. OTHER FINDINGS: USUAL DUCTAL HYPERPLASIA. Pathology results are CONCORDANT with imaging findings, per Dr. Dina Arceo. PATHOLOGY revealed: Site 2. 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 FIBROCYSTIC CHANGES WITH USUAL DUCTAL HYPERPLASIA. Pathology results are CONCORDANT with imaging findings, per Dr. Dina Arceo. Pathology results and recommendations below were discussed with patient by telephone on 03/01/2024 by Rock Hover RN. Patient reported biopsy site within normal limits with slight tenderness at the site. Post biopsy care instructions were reviewed, questions were answered and my direct phone number  was provided to patient. Patient was instructed to call [facility] if any concerns or questions arise related to the biopsy. RECOMMENDATION: The patient has a recent diagnosis of Right breast cancer and should follow her outlined treatment plan. Biopsy  results sent via Epic message to Dr. Cesar Coe by Rock Hover, RN. Pathology results reported by Rock Hover RN 03/01/2024. Electronically Signed   By: Dina  Arceo M.D.   On: 03/01/2024 14:49   Result Date: 03/01/2024 CLINICAL DATA:  73 year old female with recently diagnosed invasive lobular carcinoma in the 930 region the right breast (ribbon shaped clip). Two indeterminate areas of enhancement seen with MR imaging. MR guided biopsy is recommended. EXAM: MRI GUIDED CORE NEEDLE BIOPSY OF THE RIGHT BREAST x2 TECHNIQUE: Multiplanar, multisequence MR imaging of the right breast was performed both before and after administration of intravenous contrast. CONTRAST:  6 ml Vueway COMPARISON:  Previous exam(s). FINDINGS: I met with the patient, and we discussed the procedure of MRI guided biopsy, including risks, benefits, and alternatives. Specifically, we discussed the risks of infection, bleeding, tissue injury, clip migration, and inadequate sampling. Informed, written consent was given. The usual time out protocol was performed immediately prior to the procedure. Using sterile technique, 1% lidocaine  and 1% lidocaine  with epinephrine , MRI guidance, and a 9 gauge vacuum assisted device, biopsy was performed of non masslike enhancement in the lateral aspect of the right breast using a lateral to medial approach. At the conclusion of the procedure, a barbell shaped tissue marker clip was deployed into the biopsy cavity. Follow-up 2-view mammogram was performed and dictated separately. Using sterile technique, 1% lidocaine  and 1% lidocaine  with epinephrine , MRI guidance, and a 9 gauge vacuum assisted device, biopsy was performed of small area of  focal enhancement in the central region the right breast using a lateral to medial approach. At the conclusion of the procedure, a cylindrical shaped tissue marker clip was deployed into the biopsy cavity. Follow-up 2-view mammogram was performed and dictated separately. IMPRESSION: MRI guided biopsy of the right breast x2. No apparent complications. Electronically Signed: By: Dina  Arceo M.D. On: 02/29/2024 11:11   MM CLIP PLACEMENT RIGHT Result Date: 02/29/2024 CLINICAL DATA:  73 year old female with recently diagnosed invasive lobular carcinoma. Status post MR guided core biopsies of 2 areas of enhancement in the right breast. EXAM: 3D DIAGNOSTIC RIGHT MAMMOGRAM POST MRI BIOPSY x2 COMPARISON:  Previous exam(s). ACR Breast Density Category c: The breasts are heterogeneously dense, which may obscure small masses. FINDINGS: 3D Mammographic images were obtained following MR guided core biopsy of the right breast x2. The barbell shaped biopsy marker clip is in appropriate position in the lateral aspect of the right breast. The cylindrical shaped biopsy marker clip is in appropriate position in the medial/central aspect of the right breast. IMPRESSION: Appropriate positioning of the barbell shaped biopsy marker clip in the lateral aspect of the right breast and cylindrical shaped biopsy marker clip in the central/medial aspect of the right breast. Final Assessment: Post Procedure Mammograms for Marker Placement Electronically Signed   By: Harrie Deis M.D.   On: 02/29/2024 11:13    ASSESSMENT & PLAN:   Malignant neoplasm of upper-outer quadrant of right breast in female, estrogen receptor positive (HCC) # [SEP 2025- right breast pain]-   RIGHT breast at the 9:30 position : 8 cm from the nipple measuring 7 mm, correlating with the palpable;  No RIGHT axillary lymphadenopathy seen. No mammographic evidence of malignancy in the LEFT breast. Surgeon:Dr.Cintron  # OCT 2025: S/p mastectomy INVASIVE LOBULAR  CARCINOMA  stage I pT1c cN0; M0- ER-70%/PR 100; Her 2 NEG.G-1; ki 67-1%. NO oncotype done. No role for radiation. NO role for chemo therapy.   #I discussed the role of endocrine therapy-given  ER/PR positive disease postsurgery.  Patient will be offered antihormone pill 1 a day for 5 years.  Discussed potential downsides including but not limited to arthralgias hot flashes and osteoporosis.  Given history of hot flashes-would definitely consider pharmacologic therapy/hormonal treatment options. Called in a script for anastrozole.   # Bone health: Patient will need BMD. Will order today.   #  Genetics counseling: LOW LIEKLYHOOD of any genetic couses.patient has daughter- one.  Currently not interested in [facility].   # DISPOSITION: # Follow up in 3 months- MD;labs- cbc/cmp; vit D 25-OH; BMD prior-Dr..B    All questions were answered. The patient/family knows to call the clinic with any problems, questions or concerns.    Cindy JONELLE Joe, MD 03/28/2024 1:13 PM

## 2024-03-28 NOTE — Assessment & Plan Note (Addendum)
# [  SEP 2025- right breast pain]-   RIGHT breast at the 9:30 position : 8 cm from the nipple measuring 7 mm, correlating with the palpable;  No RIGHT axillary lymphadenopathy seen. No mammographic evidence of malignancy in the LEFT breast. Surgeon:Dr.Cintron  # OCT 2025: S/p mastectomy INVASIVE LOBULAR CARCINOMA  stage I pT1c cN0; M0- ER-70%/PR 100; Her 2 NEG.G-1; ki 67-1%.  oncotype  RA- 13; LOW risk of recurrence. NO role for chemo therapy. No role for radiation-given mastectomy.[name]   #I discussed the role of endocrine therapy-given ER/PR positive disease postsurgery.  Patient will be offered antihormone pill 1 a day for 5 years.  Discussed potential downsides including but not limited to arthralgias hot flashes and osteoporosis.  Given history of hot flashes-would definitely consider pharmacologic therapy/hormonal treatment options. Called in a script for anastrozole.   # Bone health: Patient will need BMD. Will order today.   #  Genetics counseling: LOW LIEKLYHOOD of any genetic couses.patient has daughter- one.  Currently not interested in [facility].   # DISPOSITION: # Follow up in 3 months- MD;labs- cbc/cmp; vit D 25-OH; BMD prior-Dr..B

## 2024-03-29 ENCOUNTER — Ambulatory Visit: Admitting: Internal Medicine

## 2024-03-30 ENCOUNTER — Encounter: Payer: Self-pay | Admitting: Occupational Therapy

## 2024-03-30 ENCOUNTER — Ambulatory Visit: Attending: Internal Medicine | Admitting: Occupational Therapy

## 2024-03-30 DIAGNOSIS — M25611 Stiffness of right shoulder, not elsewhere classified: Secondary | ICD-10-CM | POA: Insufficient documentation

## 2024-03-30 NOTE — Therapy (Signed)
 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    received treatment 05/2021   Psoriatic arthritis [facility])    Past Surgical History:  Procedure Laterality Date   ABDOMINAL HYSTERECTOMY     BREAST BIOPSY Right 02/04/2024   US  RT BREAST BX W LOC DEV 1ST LESION IMG BX SPEC US  GUIDE 02/04/2024 [facility]   BREAST EXCISIONAL BIOPSY Bilateral 1983   neg   CATARACT EXTRACTION W/PHACO Left 03/03/2023   Procedure: CATARACT EXTRACTION PHACO AND INTRAOCULAR LENS PLACEMENT (IOC) LEFT 5.58 00:39.1;  Surgeon: Jaye Fallow, MD;  Location: [facility];  Service: Ophthalmology;  Laterality: Left;   CATARACT EXTRACTION W/PHACO Right 03/17/2023   Procedure: CATARACT EXTRACTION PHACO AND INTRAOCULAR LENS PLACEMENT (IOC) RIGHT;  Surgeon: Jaye Fallow, MD;  Location: [facility];  Service: Ophthalmology;  Laterality: Right;  5.23 0:37.8   MASTECTOMY W/ SENTINEL NODE BIOPSY Right 03/09/2024   Procedure: MASTECTOMY WITH SENTINEL LYMPH NODE BIOPSY;  Surgeon: Rodolph Romano, MD;  Location: [facility];  Service: General;  Laterality: Right;   TONSILLECTOMY     Patient Active Problem List   Diagnosis Date Noted   Breast cancer (HCC) 03/09/2024   Malignant neoplasm of upper-outer quadrant of right breast in female, estrogen receptor positive (HCC) 02/10/2024   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  09/10/2020   Chronic bilateral low back pain with right-sided sciatica 08/19/2019   B12 deficiency 12/18/2016   Hyperlipidemia, mixed 12/18/2016    PCP: Dr Cleotilde  REFERRING PROVIDER: Dr Rennie  REFERRING DIAG: R shoulder stiffness s/o R mastectomy  THERAPY DIAG:  Stiffness of right shoulder, not elsewhere classified  Rationale for Evaluation and Treatment: Rehabilitation  ONSET DATE: 03/09/24  SUBJECTIVE:                                                                                                                                                                                           SUBJECTIVE STATEMENT: I am doing okay.  I done some exercises.  But I feel a over the right chest under my arm and in my breast.  And gradually doing a little bit more at home.  And I am seeing Dr. Cesar again tomorrow.  The scar just feels tight and  my chest.  But yes I do not need radiation and chemo.  PERTINENT HISTORY:  Cesar Hayes Lucas Aloysius, MD - 03/22/2024 11:45 AM EDT Formatting of this note might be different from the original. Patient come for drain checkup. She had total mastectomy on 03/09/2034 last week her chest drain was giving her 40 cc of output per day. Now she is coming with 15 cc/day in the last few days. Output is serous. Skin flaps look great.  Chest drain removed today.  Will follow-up in 1 week  PATIENT GOALS:   reduce lymphedema risk and learn post op HEP.   PAIN:  Are you having pain?  Slight pull under the arm and right chest with overhead shoulder range of motion and external rotation especially  PRECAUTIONS: Active CA , right upper extremity lymphedema   HAND DOMINANCE: right  WEIGHT BEARING RESTRICTIONS: No  FALLS:  Has patient fallen in last 6 months? No  LIVING ENVIRONMENT: Patient lives with: Spouse  OCCUPATION: Retired  LEISURE: Does things around the house.  Loves to do sewing and cross-stitch   OBJECTIVE:  COGNITION: Overall  cognitive status: Within functional limits for tasks assessed    POSTURE:   rounded shoulders posture  UPPER EXTREMITY AROM/PROM: Bilateral shoulder range of motion within functional limits.  Patient with a slight pull in right chest and axilla with endrange shoulder flexion and action. Decreased endrange external rotation. Reviewed with patient home exercises to do active assisted range of motion for shoulder flexion abduction low wall once or twice a day 10-12 reps pain-free In supine external rotation AROM 10 reps 2 times a day  CERVICAL AROM: All within normal limits:     UPPER EXTREMITY STRENGTH: Within functional limits  LYMPHEDEMA ASSESSMENTS:      L-DEX LYMPHEDEMA SCREENING:  The patient was assessed using the L-Dex machine today to produce a lymphedema index baseline score. The patient will be reassessed on a regular basis (typically every 3 months) to obtain new L-Dex scores. If the score is > 6.5 points away from his/her baseline score indicating onset of subclinical lymphedema, it will be recommended to wear a compression garment for 4 weeks, 12 hours per day and then be reassessed. If the score continues to be > 6.5 points from baseline at reassessment, we will initiate lymphedema treatment. Assessing in this manner has a 95% rate of preventing clinically significant lymphedema.   L-DEX FLOWSHEETS - 03/30/24 1200       L-DEX LYMPHEDEMA SCREENING   Measurement Type Unilateral    L-DEX MEASUREMENT EXTREMITY Upper Extremity    POSITION  Standing    DOMINANT SIDE Right    At Risk Side Right    BASELINE SCORE (UNILATERAL) 1.5           PATIENT EDUCATION:  Education details: Lymphedema risk reduction and post op shoulder/posture HEP Person educated: Patient Education method: Explanation, Demonstration, Handout Education comprehension: Patient verbalized understanding and returned demonstration  HOME EXERCISE PROGRAM: Lymphedema education done with patient  signs and symptoms as well as prevention.  Handout provided and reviewed that was in the cancer journal.  ASSESSMENT:  CLINICAL IMPRESSION: Patient presented OT evaluation with a diagnosis of right breast cancer with mastectomy by Dr. Rodolph  on 03/09/2024.  Drain was removed 03/22/2024.  Patient present with right active range of motion within functional limits but with a slight pull and some discomfort at endrange shoulder external rotation more than flexion and abduction.  Reviewed with patient home exercises to be performed twice a day for active assisted range of motion for flexion abduction and external rotation with gravity.  Lymphedema education was done with patient signs and symptoms as well as prevention.  And handout provided.  Patient L-Dex score baseline within normal limits.  Patient can follow-up in 3 months with the breast navigator to repeat L-Dex screens every 3 months for 2 years to detect subclinical lymphedema.  Pt will benefit from skilled therapeutic intervention to improve on the following deficits: Decreased knowledge of precautions and lymphedema education, impaired UE functional use, pain, decreased ROM, postural dysfunction.   OT treatment/interventions: ADL/self-care home management, pt/family education, therapeutic exercise,manual therapy  REHAB POTENTIAL: Good  CLINICAL DECISION MAKING: Stable/uncomplicated  EVALUATION COMPLEXITY: Low   GOALS: Goals reviewed with patient? YES  LONG TERM GOALS: (STG=LTG)    Name Target Date Goal status  1 Pt will be able to verbalize understanding of pertinent lymphedema risk reduction practices relevant to her dx specifically related to skin care.  Baseline:  No knowledge Today Achieved at eval  2 Pt will be able to return demo and/or verbalize understanding of the post op HEP related to regaining shoulder ROM. Baseline:  No knowledge Today Achieved at eval        4 Pt will demo she has regained full shoulder ROM and  function post operatively compared to baselines.  Baseline: See objective measurements taken today. 6 weeks Initial    PLAN:  OT FREQUENCY/DURATION: EVAL and 1 follow up appointment if needed  Occupational Therapy Information for After Breast Cancer Surgery/Treatment:  Lymphedema is a swelling condition that you may be at risk for in your arm if you have lymph nodes removed from the armpit area.  After a sentinel node biopsy, the risk is approximately 5-9% and is higher after an axillary node dissection.  There is treatment available for this condition and it is not life-threatening.  Contact your physician or occupational therapist with concerns. You may begin the 4 shoulder/posture exercises (see additional sheet) when permitted by your physician (typically a week after surgery).  If you have drains, you may need to wait until those are removed before beginning range of motion exercises.  A general recommendation is to not lift your arms above shoulder height until drains are removed.  These exercises should be done to your tolerance and gently.  This is not a no pain/no gain type of recovery so listen to your body and stretch into the range of motion that you can tolerate, stopping if you have pain.  If you are having immediate reconstruction, ask your plastic surgeon about doing exercises as he or she may want you to wait. .  While undergoing any medical procedure or treatment, try to avoid blood pressure being taken or needle sticks from occurring on the arm on the side of cancer.   This recommendation begins after surgery and continues for the rest of your life.  This may help reduce your risk of getting lymphedema (swelling in your arm). An excellent resource for those seeking information on lymphedema is the [employer]'s web site. It can be accessed at www.lymphnet.org If you notice swelling in your hand, arm or breast at any time following surgery (even if it  is many years  from now), please contact your doctor or occupational therapist to discuss this.  Lymphedema can be treated at any time but it is easier for you if it is treated early on.  If you feel like your shoulder motion is not returning to normal in a reasonable amount of time, please contact your surgeon or occupational therapist.  [facility] [phone]. [address] [place], [place] [address]        Ancel Peters, OTR/L, CLT 03/30/2024, 12:38 PM

## 2024-04-04 ENCOUNTER — Encounter: Payer: Self-pay | Admitting: *Deleted

## 2024-06-08 ENCOUNTER — Ambulatory Visit
Admission: RE | Admit: 2024-06-08 | Discharge: 2024-06-08 | Disposition: A | Discharge status: Home / Self Care | Source: Ambulatory Visit | Attending: Internal Medicine | Admitting: Internal Medicine

## 2024-06-08 DIAGNOSIS — C50411 Malignant neoplasm of upper-outer quadrant of right female breast: Secondary | ICD-10-CM | POA: Diagnosis present

## 2024-06-08 DIAGNOSIS — M8580 Other specified disorders of bone density and structure, unspecified site: Secondary | ICD-10-CM | POA: Insufficient documentation

## 2024-06-08 DIAGNOSIS — Z170 Estrogen receptor positive status [ER+]: Secondary | ICD-10-CM | POA: Insufficient documentation

## 2024-06-08 DIAGNOSIS — Z780 Asymptomatic menopausal state: Secondary | ICD-10-CM | POA: Insufficient documentation

## 2024-07-07 ENCOUNTER — Inpatient Hospital Stay: Admitting: *Deleted

## 2024-07-07 ENCOUNTER — Inpatient Hospital Stay: Admitting: Internal Medicine

## 2024-07-07 ENCOUNTER — Inpatient Hospital Stay
